# Patient Record
Sex: Female | Born: 1950 | Race: White | Hispanic: No | Marital: Married | Smoking: Never smoker
Health system: Southern US, Community
[De-identification: ages and names within clinical notes are randomized; demographics above are authoritative.]

## PROBLEM LIST (undated history)

## (undated) DIAGNOSIS — R011 Cardiac murmur, unspecified: Secondary | ICD-10-CM

## (undated) DIAGNOSIS — E785 Hyperlipidemia, unspecified: Secondary | ICD-10-CM

## (undated) DIAGNOSIS — Z923 Personal history of irradiation: Secondary | ICD-10-CM

## (undated) DIAGNOSIS — C801 Malignant (primary) neoplasm, unspecified: Secondary | ICD-10-CM

## (undated) DIAGNOSIS — C50919 Malignant neoplasm of unspecified site of unspecified female breast: Secondary | ICD-10-CM

## (undated) DIAGNOSIS — H269 Unspecified cataract: Secondary | ICD-10-CM

## (undated) DIAGNOSIS — B019 Varicella without complication: Secondary | ICD-10-CM

## (undated) DIAGNOSIS — T7840XA Allergy, unspecified, initial encounter: Secondary | ICD-10-CM

## (undated) DIAGNOSIS — Z9221 Personal history of antineoplastic chemotherapy: Secondary | ICD-10-CM

## (undated) HISTORY — PX: BREAST SURGERY: SHX581

## (undated) HISTORY — PX: DILATION AND CURETTAGE OF UTERUS: SHX78

## (undated) HISTORY — DX: Varicella without complication: B01.9

## (undated) HISTORY — DX: Hyperlipidemia, unspecified: E78.5

## (undated) HISTORY — DX: Unspecified cataract: H26.9

## (undated) HISTORY — DX: Cardiac murmur, unspecified: R01.1

## (undated) HISTORY — DX: Allergy, unspecified, initial encounter: T78.40XA

## (undated) HISTORY — DX: Malignant (primary) neoplasm, unspecified: C80.1

---

## 1999-11-17 DIAGNOSIS — C50919 Malignant neoplasm of unspecified site of unspecified female breast: Secondary | ICD-10-CM

## 1999-11-17 DIAGNOSIS — C801 Malignant (primary) neoplasm, unspecified: Secondary | ICD-10-CM

## 1999-11-17 DIAGNOSIS — Z9221 Personal history of antineoplastic chemotherapy: Secondary | ICD-10-CM

## 1999-11-17 DIAGNOSIS — Z853 Personal history of malignant neoplasm of breast: Secondary | ICD-10-CM | POA: Insufficient documentation

## 1999-11-17 DIAGNOSIS — Z923 Personal history of irradiation: Secondary | ICD-10-CM

## 1999-11-17 HISTORY — DX: Malignant (primary) neoplasm, unspecified: C80.1

## 1999-11-17 HISTORY — PX: BREAST LUMPECTOMY: SHX2

## 1999-11-17 HISTORY — DX: Personal history of antineoplastic chemotherapy: Z92.21

## 1999-11-17 HISTORY — PX: BREAST BIOPSY: SHX20

## 1999-11-17 HISTORY — DX: Personal history of irradiation: Z92.3

## 1999-11-17 HISTORY — DX: Malignant neoplasm of unspecified site of unspecified female breast: C50.919

## 2000-11-16 HISTORY — PX: BREAST SURGERY: SHX581

## 2006-09-27 ENCOUNTER — Ambulatory Visit: Payer: Self-pay | Admitting: Specialist

## 2007-04-01 ENCOUNTER — Ambulatory Visit: Payer: Self-pay | Admitting: Unknown Physician Specialty

## 2012-06-11 LAB — HM PAP SMEAR: HM Pap smear: NORMAL

## 2012-06-24 ENCOUNTER — Ambulatory Visit: Payer: Self-pay | Admitting: Unknown Physician Specialty

## 2012-08-18 LAB — HM COLONOSCOPY

## 2013-06-13 ENCOUNTER — Ambulatory Visit: Payer: Self-pay | Admitting: Obstetrics and Gynecology

## 2013-11-30 ENCOUNTER — Encounter: Payer: Self-pay | Admitting: Internal Medicine

## 2013-11-30 ENCOUNTER — Ambulatory Visit (INDEPENDENT_AMBULATORY_CARE_PROVIDER_SITE_OTHER): Payer: 59 | Admitting: Internal Medicine

## 2013-11-30 VITALS — BP 104/70 | HR 85 | Temp 98.0°F | Resp 16 | Wt 121.0 lb

## 2013-11-30 DIAGNOSIS — Z853 Personal history of malignant neoplasm of breast: Secondary | ICD-10-CM

## 2013-11-30 DIAGNOSIS — K5900 Constipation, unspecified: Secondary | ICD-10-CM | POA: Insufficient documentation

## 2013-11-30 DIAGNOSIS — R142 Eructation: Secondary | ICD-10-CM

## 2013-11-30 DIAGNOSIS — E876 Hypokalemia: Secondary | ICD-10-CM

## 2013-11-30 DIAGNOSIS — R143 Flatulence: Secondary | ICD-10-CM

## 2013-11-30 DIAGNOSIS — R140 Abdominal distension (gaseous): Secondary | ICD-10-CM | POA: Insufficient documentation

## 2013-11-30 DIAGNOSIS — R141 Gas pain: Secondary | ICD-10-CM

## 2013-11-30 LAB — COMPREHENSIVE METABOLIC PANEL
ALT: 17 U/L (ref 0–35)
AST: 24 U/L (ref 0–37)
Albumin: 4 g/dL (ref 3.5–5.2)
Alkaline Phosphatase: 42 U/L (ref 39–117)
BUN: 14 mg/dL (ref 6–23)
CO2: 30 meq/L (ref 19–32)
Calcium: 9.2 mg/dL (ref 8.4–10.5)
Chloride: 101 mEq/L (ref 96–112)
Creatinine, Ser: 0.8 mg/dL (ref 0.4–1.2)
GFR: 73.48 mL/min (ref >60.00)
Glucose, Bld: 67 mg/dL — ABNORMAL LOW (ref 70–99)
Potassium: 3.3 mEq/L — ABNORMAL LOW (ref 3.5–5.1)
SODIUM: 138 meq/L (ref 135–145)
TOTAL PROTEIN: 7.3 g/dL (ref 6.0–8.3)
Total Bilirubin: 0.8 mg/dL (ref 0.3–1.2)

## 2013-11-30 LAB — TSH: TSH: 0.85 u[IU]/mL (ref 0.35–5.50)

## 2013-11-30 NOTE — Patient Instructions (Signed)
Trial of amitiza twice daily or Linzess once daily for constipation  This is  my version of a  "Low GI"  Diet:  It is very fiber rich ,  Due to the flatbreads,  And atkins bars   We will e mail you the results of your labs today     All of the foods can be found at grocery stores and in bulk at [employer].  The Atkins protein bars and shakes are available in more varieties at [employer], [employer] and [place].     7 AM Breakfast:  Choose from the following:  Low carbohydrate Protein  Shakes (I recommend the EAS AdvantEdge "Carb Control" shakes  Or the low carb shakes by Atkins.    2.5 carbs   Arnold's "Sandwhich Thin"toasted  w/ peanut butter (no jelly: about 20 net carbs  "Bagel Thin" with cream cheese and salmon: about 20 carbs   a scrambled egg/bacon/cheese burrito made with Mission's "carb balance" whole wheat tortilla  (about 10 net carbs )   Avoid cereal and bananas, oatmeal and cream of wheat and grits. They are loaded with carbohydrates!   10 AM: high protein snack  Protein bar by Atkins (the snack size, under 200 cal, usually < 6 net carbs).    A stick of cheese:  Around 1 carb,  100 cal     Dannon Light n Fit [place] Yogurt  (80 cal, 8 carbs)  Other so called "protein bars" and Greek yogurts tend to be loaded with carbohydrates.  Remember, in food advertising, the word "energy" is synonymous for " carbohydrate."  Lunch:   A Sandwich using the bread choices listed, Can use any  Eggs,  lunchmeat, grilled meat or canned tuna), avocado, regular mayo/mustard  and cheese.  A Salad using blue cheese, ranch,  Goddess or vinagrette,  No croutons or "confetti" and no "candied nuts" but regular nuts OK.   No pretzels or chips.  Pickles and miniature sweet peppers are a good low carb alternative that provide a "crunch"  The bread is the only source of carbohydrate in a sandwich and  can be decreased by trying some of these alternatives to traditional loaf bread  Joseph's makes a pita bread  and a flat bread that are 50 cal and 4 net carbs available at [place] and [place].  This can be toasted to use with hummous as well  Toufayan makes a low carb flatbread that's 100 cal and 9 net carbs available at [employer] and [employer]J's makes 2 sizes of  Low carb whole wheat tortilla  (The large one is 210 cal and 6 net carbs) Avoid "Low fat dressings, as well as Barry Brunner and [place] dressings They are loaded with sugar!   3 PM/ Mid day  Snack:  Consider  1 ounce of  almonds, walnuts, pistachios, pecans, peanuts,  Macadamia nuts or a nut medley.  Avoid "granola"; the dried cranberries and raisins are loaded with carbohydrates. Mixed nuts as long as there are no raisins,  cranberries or dried fruit.     6 PM  Dinner:     Meat/fowl/fish with a green salad, and either broccoli, cauliflower, green beans, spinach, brussel sprouts or  Lima beans. DO NOT BREAD THE PROTEIN!!      There is a low carb pasta by Dreamfield's that is acceptable and tastes great: only 5 digestible carbs/serving.( All grocery stores but BJs carry it )  Try Hurley Cisco Angelo's chicken piccata or chicken or  eggplant parm over low carb pasta.(Lowes and BJs)   Marjory Lies Sanchez's "Carnitas" (pulled pork, no sauce,  0 carbs) or his beef pot roast to make a dinner burrito (at [employer]J's)  Pesto over low carb pasta (bj's sells a good quality pesto in the center refrigerated section of the deli   Whole wheat pasta is still full of digestible carbs and  Not as low in glycemic index as Dreamfield's.   Brown rice is still rice,  So skip the rice and noodles if you eat [place] or [place] (or at least limit to 1/2 cup)  9 PM snack :   Breyer's "low carb" fudgsicle or  ice cream bar (Carb Smart line), or  Weight Watcher's ice cream bar , or another "no sugar added" ice cream;  a serving of fresh berries/cherries with whipped cream   Cheese or DANNON'S LlGHT N FIT G[place] YOGURT  Avoid bananas, pineapple, grapes  and watermelon on a regular basis  because they are high in sugar.  THINK OF THEM AS DESSERT  Remember that snack Substitutions should be less than 10 NET carbs per serving and meals < 20 carbs. Remember to subtract fiber grams to get the "net carbs."

## 2013-11-30 NOTE — Progress Notes (Signed)
Patient ID: Mackenzie Dean, female   DOB: Feb 20, 1951, 63 y.o.   MRN: [id number]   Patient Active Problem List   Diagnosis Date Noted  . Hypokalemia 12/02/2013  . History of breast cancer in female 12/02/2013  . Abdominal bloating 11/30/2013  . Unspecified constipation 11/30/2013    Subjective:  CC:   Chief Complaint  Patient presents with  . Establish Care    HPI:   Mackenzie Dean is a 63 y.o. female who presents as a new patient to establish primary care with the chief complaint of   Lower abdominal swelling, accompanied by mild discomfort and recurrent constipation.   Concerned abut ovarian Ca.  Has all the symptoms .  No prior imaging  Bowel movements have become constipated  Aggravated by travel.  Last colonoscopy was 2013 or 2014  Was normal. turning 62 transferred from [facility]   History of breast cancer  2001 diagnosed during perimenopause.  Left breast tumor was missed by her screening mammogram.  But calcifications were seen on the right and biopsied by Dr  Bary Castilla.  The path report was not malignant but not normal, so lumpectomy was planned,  And the night before the planned lumkpectomy of the right breast , while lying on her side, she felt a lump on the left breast!  Was biopsied along with the calcifications and the malignancy was found .    Went to [facility] for [employer]. She was treated with lumpectomy,  Chemo and XRT  She took adjunctivejhormonal therapy with tamoxifen, then aromasin and finally Femara due to joint pain .  At the 5 year mark the adjuncitve hormone therapy  And Evista was started both for ongoing risk reduction and osteopenia.   Notices it raises her cholesterol. was stopped . treated at [facility] by Fonnie Jarvis oncologist. Lum Babe retired,  Surgeon  All the originial team is gone. seeon annually now and wants t move on.  [facility] for [facility] fore ovary surveillance due to tamoxifen ,  Annual pelvic .   Annual MRI of breasts , last one done April  2014 at [facility] mammogram was done here and images sent to [facility] in July 2014.Marland Kitchen   Past Medical History  Diagnosis Date  . Cancer 2001    Breast  . Chicken pox   . Allergy   . Hyperlipidemia     Past Surgical History  Procedure Laterality Date  . Breast surgery Left 2002    Family History  Problem Relation Age of Onset  . Mental retardation Mother   . Heart disease Mother   . Heart disease Father   . Stroke Father     History   Social History  . Marital Status: Married    Spouse Name: N/A    Number of Children: N/A  . Years of Education: N/A   Occupational History  . Not on file.   Social History Main Topics  . Smoking status: Never Smoker   . Smokeless tobacco: Never Used  . Alcohol Use: Yes  . Drug Use: No  . Sexual Activity: Yes   Other Topics Concern  . Not on file   Social History Narrative  . No narrative on file    Allergies  Allergen Reactions  . Augmentin [Amoxicillin-Pot Clavulanate] Diarrhea      Review of Systems:  Patient denies headache, fevers, malaise, unintentional weight loss, skin rash, eye pain, sinus congestion and sinus pain, sore throat, dysphagia,  hemoptysis , cough, dyspnea, wheezing, chest pain, palpitations, orthopnea,  edema, abdominal pain, nausea, melena, diarrhea, constipation, flank pain, dysuria, hematuria, urinary  Frequency, nocturia, numbness, tingling, seizures,  Focal weakness, Loss of consciousness,  Tremor, insomnia, depression, anxiety, and suicidal ideation.         Objective:  BP 104/70  Pulse 85  Temp(Src) 98 F (36.7 C) (Oral)  Resp 16  Wt 121 lb (54.885 kg)  SpO2 99%  General appearance: alert, cooperative and appears stated age Ears: normal TM's and external ear canals both ears Throat: lips, mucosa, and tongue normal; teeth and gums normal Neck: no adenopathy, no carotid bruit, supple, symmetrical, trachea midline and thyroid not enlarged, symmetric, no tenderness/mass/nodules Back: symmetric,  no curvature. ROM normal. No CVA tenderness. Lungs: clear to auscultation bilaterally Heart: regular rate and rhythm, S1, S2 normal, no murmur, click, rub or gallop Abdomen: soft, non-tender; bowel sounds normal; no masses,  no organomegaly Pulses: 2+ and symmetric Skin: Skin color, texture, turgor normal. No rashes or lesions Lymph nodes: Cervical, supraclavicular, and axillary nodes normal.  Assessment and Plan:  Abdominal bloating Ultrasound and CA 125 were reassuring for no signs o f ovarian CA.  Constipation may be source.  Will give samples of linzess and amitiza to try.  Hypokalemia Unclear etiology but may be due to low mg  Will replace and repeat in one month  History of breast cancer in female Patient's treatment was completed at [facility] but the original team has either retired or left the system.  She would like to consider local surveillance . Has been receiving annualr MRi alternating with mammogram.  Records requested  A total of 60 minutes was spent with patient more than half of which was spent in counseling, reviewing records from other prviders and coordination of care.  Updated Medication List Outpatient Encounter Prescriptions as of 11/30/2013  Medication Sig  . calcium-vitamin D (OSCAL WITH D) 500-200 MG-UNIT per tablet Take 1 tablet by mouth daily with breakfast.  . cetirizine (ZYRTEC) 10 MG tablet Take 10 mg by mouth daily.  Marland Kitchen estradiol (ESTRACE) 0.1 MG/GM vaginal cream Place 1 Applicatorful vaginally once a week.  . Omega-3 Fatty Acids (FISH OIL) 1200 MG CAPS Take 1 capsule by mouth 2 (two) times daily.  . potassium chloride (K-DUR,KLOR-CON) 10 MEQ tablet Take 1 tablet (10 mEq total) by mouth 2 (two) times daily.  . raloxifene (EVISTA) 60 MG tablet Take 60 mg by mouth daily.     Orders Placed This Encounter  Procedures  . US Transvaginal Non-OB  . CA 125  . Comp Met (CMET)  . TSH    No Follow-up on file.

## 2013-12-01 ENCOUNTER — Ambulatory Visit: Payer: Self-pay | Admitting: Internal Medicine

## 2013-12-01 LAB — CA 125: CA 125: 6.8 U/mL (ref 0.0–30.2)

## 2013-12-02 ENCOUNTER — Encounter: Payer: Self-pay | Admitting: Internal Medicine

## 2013-12-02 DIAGNOSIS — Z853 Personal history of malignant neoplasm of breast: Secondary | ICD-10-CM | POA: Insufficient documentation

## 2013-12-02 DIAGNOSIS — E876 Hypokalemia: Secondary | ICD-10-CM | POA: Insufficient documentation

## 2013-12-02 MED ORDER — POTASSIUM CHLORIDE CRYS ER 10 MEQ PO TBCR: 10.0000 meq | EXTENDED_RELEASE_TABLET | Freq: Two times a day (BID) | ORAL | Status: DC

## 2013-12-02 NOTE — Assessment & Plan Note (Signed)
Unclear etiology but may be due to low mg  Will replace and repeat in one month

## 2013-12-02 NOTE — Assessment & Plan Note (Signed)
Patient's treatment was completed at [facility] but the original team has either retired or left the system.  She would like to consider local surveillance . Has been receiving annualr MRi alternating with mammogram.  Records requested

## 2013-12-02 NOTE — Assessment & Plan Note (Addendum)
Ultrasound and CA 125 were reassuring for no signs o f ovarian CA.  Constipation may be source.  Will give samples of linzess and amitiza to try.

## 2014-01-09 ENCOUNTER — Encounter: Payer: Self-pay | Admitting: Internal Medicine

## 2014-02-09 ENCOUNTER — Ambulatory Visit (INDEPENDENT_AMBULATORY_CARE_PROVIDER_SITE_OTHER)
Admission: RE | Admit: 2014-02-09 | Discharge: 2014-02-09 | Disposition: A | Discharge status: Home / Self Care | Payer: 59 | Source: Ambulatory Visit | Attending: Internal Medicine | Admitting: Internal Medicine

## 2014-02-09 ENCOUNTER — Ambulatory Visit (INDEPENDENT_AMBULATORY_CARE_PROVIDER_SITE_OTHER): Payer: 59 | Admitting: Internal Medicine

## 2014-02-09 ENCOUNTER — Encounter: Payer: Self-pay | Admitting: Internal Medicine

## 2014-02-09 VITALS — BP 110/72 | HR 87 | Temp 97.8°F | Wt 119.0 lb

## 2014-02-09 DIAGNOSIS — R05 Cough: Secondary | ICD-10-CM

## 2014-02-09 DIAGNOSIS — R059 Cough, unspecified: Secondary | ICD-10-CM

## 2014-02-09 DIAGNOSIS — J209 Acute bronchitis, unspecified: Secondary | ICD-10-CM

## 2014-02-09 LAB — HM MAMMOGRAPHY: HM MAMMO: NORMAL

## 2014-02-09 MED ORDER — ALBUTEROL SULFATE HFA 108 (90 BASE) MCG/ACT IN AERS: 2.0000 | INHALATION_SPRAY | Freq: Four times a day (QID) | RESPIRATORY_TRACT | Status: DC | PRN

## 2014-02-09 NOTE — Progress Notes (Signed)
Pre visit review using our clinic review tool, if applicable. No additional management support is needed unless otherwise documented below in the visit note. 

## 2014-02-09 NOTE — Progress Notes (Signed)
Subjective:    Patient ID: Mackenzie Dean, female    DOB: [birth year], 63 y.o.   MRN: [id number]  HPI  Pt presents to the clinic today withc/o persistent cough. She reports this started 4 weeks ago. It originally started out as a cold. That last for a week then got better. Over the next week, she felt like it started to drain into her chest. The cough was productive at that time. She did take a zpack. Since that time, she feels like the cough has gotten and gotten more congested. The cough is not really productive at this time. She did have fever, but denies chills or body aches. She has not taken anything else OTC. She has no history of allergies or asthma. She does not smoke. She has not had sick contacts.  Review of Systems      Past Medical History  Diagnosis Date  . Cancer 2001    Breast  . Chicken pox   . Allergy   . Hyperlipidemia     Current Outpatient Prescriptions  Medication Sig Dispense Refill  . aspirin 81 MG tablet Take 81 mg by mouth daily.      . calcium-vitamin D (OSCAL WITH D) 500-200 MG-UNIT per tablet Take 1 tablet by mouth daily with breakfast.      . cetirizine (ZYRTEC) 10 MG tablet Take 10 mg by mouth daily.      Marland Kitchen estradiol (ESTRACE) 0.1 MG/GM vaginal cream Place 1 Applicatorful vaginally once a week.      . Multiple Vitamins-Minerals (MULTIVITAMIN PO) Take 1 capsule by mouth daily.      . Omega-3 Fatty Acids (FISH OIL) 1200 MG CAPS Take 1 capsule by mouth 2 (two) times daily.      . potassium chloride (K-DUR,KLOR-CON) 10 MEQ tablet Take 1 tablet (10 mEq total) by mouth 2 (two) times daily.  10 tablet  1  . raloxifene (EVISTA) 60 MG tablet Take 60 mg by mouth daily.      Marland Kitchen albuterol (PROVENTIL HFA;VENTOLIN HFA) 108 (90 BASE) MCG/ACT inhaler Inhale 2 puffs into the lungs every 6 (six) hours as needed for wheezing or shortness of breath.  1 Inhaler  0   No current facility-administered medications for this visit.    Allergies  Allergen Reactions  .  Augmentin [Amoxicillin-Pot Clavulanate] Diarrhea    Family History  Problem Relation Age of Onset  . Mental retardation Mother   . Heart disease Mother   . Heart disease Father   . Stroke Father     History   Social History  . Marital Status: Married    Spouse Name: N/A    Number of Children: N/A  . Years of Education: N/A   Occupational History  . Not on file.   Social History Main Topics  . Smoking status: Never Smoker   . Smokeless tobacco: Never Used  . Alcohol Use: Yes  . Drug Use: No  . Sexual Activity: Yes   Other Topics Concern  . Not on file   Social History Narrative  . No narrative on file     Constitutional: Denies fever, malaise, fatigue, headache or abrupt weight changes.  HEENT: Denies eye pain, eye redness, ear pain, ringing in the ears, wax buildup, runny nose, nasal congestion, bloody nose, or sore throat. Respiratory: Pt reports cough. Denies difficulty breathing, shortness of breath,  or sputum production.     No other specific complaints in a complete review of systems (except as listed in HPI  above).  Objective:   Physical Exam   BP 110/72  Pulse 87  Temp(Src) 97.8 F (36.6 C) (Oral)  Wt 119 lb (53.978 kg)  SpO2 99% Wt Readings from Last 3 Encounters:  02/09/14 119 lb (53.978 kg)  11/30/13 121 lb (54.885 kg)    General: Appears her stated age, well developed, well nourished in NAD. HEENT: Head: normal shape and size; Eyes: sclera white, no icterus, conjunctiva pink, PERRLA and EOMs intact; Ears: Tm's gray and intact, normal light reflex; Nose: mucosa pink and moist, septum midline; Throat/Mouth: Teeth present, mucosa pink and moist, no exudate, lesions or ulcerations noted.  Cardiovascular: Normal rate and rhythm. S1,S2 noted.  No murmur, rubs or gallops noted. No JVD or BLE edema. No carotid bruits noted. Pulmonary/Chest: Normal effort and positive vesicular breath sounds. No respiratory distress. No wheezes, rales or ronchi noted.          Assessment & Plan:   Cough, likely lingering from acute bronchitis:  Will check xray today to r/o pna She declines rx for benzonate She would like a refill of albuterol inhaler that she tool before when she had bronchitis  RTC as needed or if symptoms persist or worsen

## 2014-02-09 NOTE — Patient Instructions (Addendum)

## 2014-06-11 ENCOUNTER — Encounter: Payer: 59 | Admitting: Internal Medicine

## 2014-06-11 ENCOUNTER — Ambulatory Visit (INDEPENDENT_AMBULATORY_CARE_PROVIDER_SITE_OTHER): Payer: 59 | Admitting: Internal Medicine

## 2014-06-11 ENCOUNTER — Encounter: Payer: Self-pay | Admitting: Internal Medicine

## 2014-06-11 VITALS — BP 114/70 | HR 86 | Temp 98.4°F | Resp 16 | Ht 64.0 in | Wt 119.5 lb

## 2014-06-11 DIAGNOSIS — H60399 Other infective otitis externa, unspecified ear: Secondary | ICD-10-CM

## 2014-06-11 DIAGNOSIS — Z23 Encounter for immunization: Secondary | ICD-10-CM

## 2014-06-11 DIAGNOSIS — Z8739 Personal history of other diseases of the musculoskeletal system and connective tissue: Secondary | ICD-10-CM | POA: Diagnosis not present

## 2014-06-11 DIAGNOSIS — Z853 Personal history of malignant neoplasm of breast: Secondary | ICD-10-CM

## 2014-06-11 DIAGNOSIS — Z2911 Encounter for prophylactic immunotherapy for respiratory syncytial virus (RSV): Secondary | ICD-10-CM | POA: Diagnosis not present

## 2014-06-11 DIAGNOSIS — R079 Chest pain, unspecified: Secondary | ICD-10-CM | POA: Diagnosis not present

## 2014-06-11 DIAGNOSIS — Z0000 Encounter for general adult medical examination without abnormal findings: Secondary | ICD-10-CM | POA: Diagnosis not present

## 2014-06-11 DIAGNOSIS — R002 Palpitations: Secondary | ICD-10-CM | POA: Diagnosis not present

## 2014-06-11 DIAGNOSIS — H60392 Other infective otitis externa, left ear: Secondary | ICD-10-CM

## 2014-06-11 MED ORDER — CLOTRIMAZOLE-BETAMETHASONE 1-0.05 % EX CREA: 1.0000 "application " | TOPICAL_CREAM | Freq: Two times a day (BID) | CUTANEOUS | Status: DC

## 2014-06-11 NOTE — Progress Notes (Signed)
Patient ID: Mackenzie Dean, female   DOB: 18-Mar-1951, 63 y.o.   MRN: [id number]    Subjective:     Mackenzie Dean is a 63 y.o. female and is here for a comprehensive physical exam. The patient reports problems - as follows.  Her  last PAP smear was normal in 2013.  She has no history of abnormal PAP smears. She is requesting pelvic exam today.   She is a breast cancer survivor who is no longer under surveillance by [facility], and would like ongoing surveillance with breast MRI annually.  Cc:  She has been having palpitations which have been occurring infrequently .  The feeling is accompanied by a feeling of chest pressure and feeling off balance without true vertigo. She has a FH of atrial fib in mother, who eventually developed SSS needing a  pacemaker.  Paternal uncle also had early CAD,  And there is a strong FH of stroke in both parents. Her husband Dr Golden Pop is requesting a cardiology evaluation    History   Social History  . Marital Status: Married    Spouse Name: N/A    Number of Children: N/A  . Years of Education: N/A   Occupational History  . Not on file.   Social History Main Topics  . Smoking status: Never Smoker   . Smokeless tobacco: Never Used  . Alcohol Use: Yes  . Drug Use: No  . Sexual Activity: Yes   Other Topics Concern  . Not on file   Social History Narrative  . No narrative on file   Health Maintenance  Topic Date Due  . Colonoscopy  10/26/2003  . Influenza Vaccine  06/16/2014  . Pap Smear  06/12/2015  . Mammogram  02/10/2016  . Tetanus/tdap  06/06/2023  . Zostavax  Completed    The following portions of the patient's history were reviewed and updated as appropriate: allergies, current medications, past family history, past medical history, past social history, past surgical history and problem list.  Review of Systems A comprehensive review of systems was negative except for: Cardiovascular: positive for exertional chest  pressure/discomfort and palpitations   Objective:   BP 114/70  Pulse 86  Temp(Src) 98.4 F (36.9 C) (Oral)  Resp 16  Ht 5\' 4"  (1.626 m)  Wt 119 lb 8 oz (54.205 kg)  BMI 20.50 kg/m2  SpO2 97%  General Appearance:    Alert, cooperative, no distress, appears stated age  Head:    Normocephalic, without obvious abnormality, atraumatic  Eyes:    PERRL, conjunctiva/corneas clear, EOM's intact, fundi    benign, both eyes  Ears:    Normal TM's and external ear canals, both ears  Nose:   Nares normal, septum midline, mucosa normal, no drainage    or sinus tenderness  Throat:   Lips, mucosa, and tongue normal; teeth and gums normal  Neck:   Supple, symmetrical, trachea midline, no adenopathy;    thyroid:  no enlargement/tenderness/nodules; no carotid   bruit or JVD  Back:     Symmetric, no curvature, ROM normal, no CVA tenderness  Lungs:     Clear to auscultation bilaterally, respirations unlabored  Chest Wall:    No tenderness or deformity   Heart:    Regular rate and rhythm, S1 and S2 normal, no murmur, rub   or gallop  Breast Exam:    No tenderness, masses, or nipple abnormality  Abdomen:     Soft, non-tender, bowel sounds active all four quadrants,  no masses, no organomegaly  Genitalia:    Pelvic: cervix normal in appearance, external genitalia normal, no adnexal masses or tenderness, no cervical motion tenderness, rectovaginal septum normal, uterus normal size, shape, and consistency and vagina normal without discharge  Extremities:   Extremities normal, atraumatic, no cyanosis or edema  Pulses:   2+ and symmetric all extremities  Skin:   Skin color, texture, turgor normal, no rashes or lesions  Lymph nodes:   Cervical, supraclavicular, and axillary nodes normal  Neurologic:   CNII-XII intact, normal strength, sensation and reflexes    throughout     Assessment and Plan:   Palpitations Her exam today is normal.  However, referral to cardiology is requested by her husband Dr  Jeananne Rama due to [place] of arrhythmia.   Referral is in process  Chest pain, unspecified Occasional epsiodes of chest pressure,  Mild. Occurring with and without exertion.  She has no personal RFs for CAD but has a FH .   Her exam today is normal.  However, referral to cardiology is requested by her husband Dr Jeananne Rama due to [facility] of arrhythmia.   Referral is in process  History of breast cancer in female Annual mammogram and breast MRI to be done at [facility] in October, ordered by me, per patient request. Breast exam was done today  H/O osteopenia Will repeat DEXA in Fall   Visit for preventive health examination Annual comprehensive exam was done including breast and pelvic exam . All screenings have been addressed .   A total of 60 minutes was spent with patient more than half of which was spent in counseling, reviewing records from other prviders and coordination of care.  Updated Medication List Outpatient Encounter Prescriptions as of 06/11/2014  Medication Sig  . aspirin 81 MG tablet Take 81 mg by mouth daily.  . calcium-vitamin D (OSCAL WITH D) 500-200 MG-UNIT per tablet Take 1 tablet by mouth daily with breakfast.  . cetirizine (ZYRTEC) 10 MG tablet Take 10 mg by mouth daily.  . clotrimazole-betamethasone (LOTRISONE) lotion Apply 1 application topically 2 (two) times daily.  Marland Kitchen estradiol (ESTRACE) 0.1 MG/GM vaginal cream Place 1 Applicatorful vaginally once a week.  . Multiple Vitamins-Minerals (MULTIVITAMIN PO) Take 1 capsule by mouth daily.  . Omega-3 Fatty Acids (FISH OIL) 1200 MG CAPS Take 1 capsule by mouth 2 (two) times daily.  . raloxifene (EVISTA) 60 MG tablet Take 60 mg by mouth daily.  Marland Kitchen albuterol (PROVENTIL HFA;VENTOLIN HFA) 108 (90 BASE) MCG/ACT inhaler Inhale 2 puffs into the lungs every 6 (six) hours as needed for wheezing or shortness of breath.  . clotrimazole-betamethasone (LOTRISONE) cream Apply 1 application topically 2 (two) times daily.  . potassium chloride  (K-DUR,KLOR-CON) 10 MEQ tablet Take 1 tablet (10 mEq total) by mouth 2 (two) times daily.

## 2014-06-11 NOTE — Patient Instructions (Signed)
You had your annual  wellness exam today.  We will repeat your PAP smear in 2018, sooner if needed   We will schedule your mammogram at [facility] in  August and your MRI in December .  You  May be due for the TDaP vaccine and the Singles vaccine   Please make an appt for fasting labs at your earliest convenience.   We will contact you with the bloodwork results  Health Maintenance Adopting a healthy lifestyle and getting preventive care can go a long way to promote health and wellness. Talk with your health care provider about what schedule of regular examinations is right for you. This is a good chance for you to check in with your provider about disease prevention and staying healthy. In between checkups, there are plenty of things you can do on your own. Experts have done a lot of research about which lifestyle changes and preventive measures are most likely to keep you healthy. Ask your health care provider for more information. WEIGHT AND DIET  Eat a healthy diet  Be sure to include plenty of vegetables, fruits, low-fat dairy products, and lean protein.  Do not eat a lot of foods high in solid fats, added sugars, or salt.  Get regular exercise. This is one of the most important things you can do for your health.  Most adults should exercise for at least 150 minutes each week. The exercise should increase your heart rate and make you sweat (moderate-intensity exercise).  Most adults should also do strengthening exercises at least twice a week. This is in addition to the moderate-intensity exercise.  Maintain a healthy weight  Body mass index (BMI) is a measurement that can be used to identify possible weight problems. It estimates body fat based on height and weight. Your health care provider can help determine your BMI and help you achieve or maintain a healthy weight.  For females 55 years of age and older:   A BMI below 18.5 is considered underweight.  A BMI of 18.5 to 24.9 is  normal.  A BMI of 25 to 29.9 is considered overweight.  A BMI of 30 and above is considered obese.  Watch levels of cholesterol and blood lipids  You should start having your blood tested for lipids and cholesterol at 61 years of age, then have this test every 5 years.  You may need to have your cholesterol levels checked more often if:  Your lipid or cholesterol levels are high.  You are older than 61 years of age.  You are at high risk for heart disease.  CANCER SCREENING   Lung Cancer  Lung cancer screening is recommended for adults 83-33 years old who are at high risk for lung cancer because of a history of smoking.  A yearly low-dose CT scan of the lungs is recommended for people who:  Currently smoke.  Have quit within the past 15 years.  Have at least a 30-pack-year history of smoking. A pack year is smoking an average of one pack of cigarettes a day for 1 year.  Yearly screening should continue until it has been 15 years since you quit.  Yearly screening should stop if you develop a health problem that would prevent you from having lung cancer treatment.  Breast Cancer  Practice breast self-awareness. This means understanding how your breasts normally appear and feel.  It also means doing regular breast self-exams. Let your health care provider know about any changes, no matter how small.  If you are in your 20s or 30s, you should have a clinical breast exam (CBE) by a health care provider every 1-3 years as part of a regular health exam.  If you are 70 or older, have a CBE every year. Also consider having a breast X-ray (mammogram) every year.  If you have a family history of breast cancer, talk to your health care provider about genetic screening.  If you are at high risk for breast cancer, talk to your health care provider about having an MRI and a mammogram every year.  Breast cancer gene (BRCA) assessment is recommended for women who have family members  with BRCA-related cancers. BRCA-related cancers include:  Breast.  Ovarian.  Tubal.  Peritoneal cancers.  Results of the assessment will determine the need for genetic counseling and BRCA1 and BRCA2 testing. Cervical Cancer Routine pelvic examinations to screen for cervical cancer are no longer recommended for nonpregnant women who are considered low risk for cancer of the pelvic organs (ovaries, uterus, and vagina) and who do not have symptoms. A pelvic examination may be necessary if you have symptoms including those associated with pelvic infections. Ask your health care provider if a screening pelvic exam is right for you.   The Pap test is the screening test for cervical cancer for women who are considered at risk.  If you had a hysterectomy for a problem that was not cancer or a condition that could lead to cancer, then you no longer need Pap tests.  If you are older than 65 years, and you have had normal Pap tests for the past 10 years, you no longer need to have Pap tests.  If you have had past treatment for cervical cancer or a condition that could lead to cancer, you need Pap tests and screening for cancer for at least 20 years after your treatment.  If you no longer get a Pap test, assess your risk factors if they change (such as having a new sexual partner). This can affect whether you should start being screened again.  Some women have medical problems that increase their chance of getting cervical cancer. If this is the case for you, your health care provider may recommend more frequent screening and Pap tests.  The human papillomavirus (HPV) test is another test that may be used for cervical cancer screening. The HPV test looks for the virus that can cause cell changes in the cervix. The cells collected during the Pap test can be tested for HPV.  The HPV test can be used to screen women 34 years of age and older. Getting tested for HPV can extend the interval between normal  Pap tests from three to five years.  An HPV test also should be used to screen women of any age who have unclear Pap test results.  After 61 years of age, women should have HPV testing as often as Pap tests.  Colorectal Cancer  This type of cancer can be detected and often prevented.  Routine colorectal cancer screening usually begins at 61 years of age and continues through 61 years of age.  Your health care provider may recommend screening at an earlier age if you have risk factors for colon cancer.  Your health care provider may also recommend using home test kits to check for hidden blood in the stool.  A small camera at the end of a tube can be used to examine your colon directly (sigmoidoscopy or colonoscopy). This is done to check for  the earliest forms of colorectal cancer.  Routine screening usually begins at age 9.  Direct examination of the colon should be repeated every 5-10 years through 61 years of age. However, you may need to be screened more often if early forms of precancerous polyps or small growths are found. Skin Cancer  Check your skin from head to toe regularly.  Tell your health care provider about any new moles or changes in moles, especially if there is a change in a mole's shape or color.  Also tell your health care provider if you have a mole that is larger than the size of a pencil eraser.  Always use sunscreen. Apply sunscreen liberally and repeatedly throughout the day.  Protect yourself by wearing long sleeves, pants, a wide-brimmed hat, and sunglasses whenever you are outside. HEART DISEASE, DIABETES, AND HIGH BLOOD PRESSURE   Have your blood pressure checked at least every 1-2 years. High blood pressure causes heart disease and increases the risk of stroke.  If you are between 19 years and 30 years old, ask your health care provider if you should take aspirin to prevent strokes.  Have regular diabetes screenings. This involves taking a blood  sample to check your fasting blood sugar level.  If you are at a normal weight and have a low risk for diabetes, have this test once every three years after 61 years of age.  If you are overweight and have a high risk for diabetes, consider being tested at a younger age or more often. PREVENTING INFECTION  Hepatitis B  If you have a higher risk for hepatitis B, you should be screened for this virus. You are considered at high risk for hepatitis B if:  You were born in a country where hepatitis B is common. Ask your health care provider which countries are considered high risk.  Your parents were born in a high-risk country, and you have not been immunized against hepatitis B (hepatitis B vaccine).  You have HIV or AIDS.  You use needles to inject street drugs.  You live with someone who has hepatitis B.  You have had sex with someone who has hepatitis B.  You get hemodialysis treatment.  You take certain medicines for conditions, including cancer, organ transplantation, and autoimmune conditions. Hepatitis C  Blood testing is recommended for:  Everyone born from [birth year] through 1963.  Anyone with known risk factors for hepatitis C. Sexually transmitted infections (STIs)  You should be screened for sexually transmitted infections (STIs) including gonorrhea and chlamydia if:  You are sexually active and are younger than 61 years of age.  You are older than 61 years of age and your health care provider tells you that you are at risk for this type of infection.  Your sexual activity has changed since you were last screened and you are at an increased risk for chlamydia or gonorrhea. Ask your health care provider if you are at risk.  If you do not have HIV, but are at risk, it may be recommended that you take a prescription medicine daily to prevent HIV infection. This is called pre-exposure prophylaxis (PrEP). You are considered at risk if:  You are sexually active and do not  regularly use condoms or know the HIV status of your partner(s).  You take drugs by injection.  You are sexually active with a partner who has HIV. Talk with your health care provider about whether you are at high risk of being infected with HIV. If you  choose to begin PrEP, you should first be tested for HIV. You should then be tested every 3 months for as long as you are taking PrEP.  PREGNANCY   If you are premenopausal and you may become pregnant, ask your health care provider about preconception counseling.  If you may become pregnant, take 400 to 800 micrograms (mcg) of folic acid every day.  If you want to prevent pregnancy, talk to your health care provider about birth control (contraception). OSTEOPOROSIS AND MENOPAUSE   Osteoporosis is a disease in which the bones lose minerals and strength with aging. This can result in serious bone fractures. Your risk for osteoporosis can be identified using a bone density scan.  If you are 101 years of age or older, or if you are at risk for osteoporosis and fractures, ask your health care provider if you should be screened.  Ask your health care provider whether you should take a calcium or vitamin D supplement to lower your risk for osteoporosis.  Menopause may have certain physical symptoms and risks.  Hormone replacement therapy may reduce some of these symptoms and risks. Talk to your health care provider about whether hormone replacement therapy is right for you.  HOME CARE INSTRUCTIONS   Schedule regular health, dental, and eye exams.  Stay current with your immunizations.   Do not use any tobacco products including cigarettes, chewing tobacco, or electronic cigarettes.  If you are pregnant, do not drink alcohol.  If you are breastfeeding, limit how much and how often you drink alcohol.  Limit alcohol intake to no more than 1 drink per day for nonpregnant women. One drink equals 12 ounces of beer, 5 ounces of wine, or 1  ounces of hard liquor.  Do not use street drugs.  Do not share needles.  Ask your health care provider for help if you need support or information about quitting drugs.  Tell your health care provider if you often feel depressed.  Tell your health care provider if you have ever been abused or do not feel safe at home. Document Released: 05/18/2011 Document Revised: 03/19/2014 Document Reviewed: 10/04/2013 [facility] Patient Information 2015 [place], [place]. This information is not intended to replace advice given to you by your health care provider. Make sure you discuss any questions you have with your health care provider.

## 2014-06-12 ENCOUNTER — Telehealth: Payer: Self-pay | Admitting: *Deleted

## 2014-06-12 DIAGNOSIS — Z8739 Personal history of other diseases of the musculoskeletal system and connective tissue: Secondary | ICD-10-CM

## 2014-06-12 DIAGNOSIS — E785 Hyperlipidemia, unspecified: Secondary | ICD-10-CM

## 2014-06-12 DIAGNOSIS — Z0000 Encounter for general adult medical examination without abnormal findings: Secondary | ICD-10-CM | POA: Insufficient documentation

## 2014-06-12 DIAGNOSIS — R002 Palpitations: Secondary | ICD-10-CM

## 2014-06-12 DIAGNOSIS — K5900 Constipation, unspecified: Secondary | ICD-10-CM

## 2014-06-12 NOTE — Assessment & Plan Note (Signed)
Annual mammogram and breast MRI to be done at [facility] in October, ordered by me, per patient request. Breast exam was done today

## 2014-06-12 NOTE — Assessment & Plan Note (Addendum)
Occasional epsiodes of chest pressure,  Mild. Occurring with and without exertion.  She has no personal RFs for CAD but has a FH .   Her exam today is normal.  However, referral to cardiology is requested by her husband Dr Jeananne Rama due to [facility] of arrhythmia.   Referral is in process

## 2014-06-12 NOTE — Assessment & Plan Note (Signed)
Annual comprehensive exam was done including breast and pelvic exam. All screenings have been addressed .  

## 2014-06-12 NOTE — Telephone Encounter (Signed)
Pt is coming in tomorrow what labs and dx?  

## 2014-06-12 NOTE — Assessment & Plan Note (Addendum)
Her exam today is normal.  However, referral to cardiology is requested by her husband Dr Jeananne Rama due to [facility] of arrhythmia.   Referral is in process

## 2014-06-12 NOTE — Assessment & Plan Note (Signed)
Will repeat DEXA in Fall

## 2014-06-12 NOTE — Addendum Note (Signed)
Addended by: Crecencio Mc on: 06/12/2014 05:46 PM   Modules accepted: Orders

## 2014-06-13 ENCOUNTER — Other Ambulatory Visit (INDEPENDENT_AMBULATORY_CARE_PROVIDER_SITE_OTHER): Payer: 59

## 2014-06-13 ENCOUNTER — Encounter: Payer: Self-pay | Admitting: Internal Medicine

## 2014-06-13 DIAGNOSIS — Z8739 Personal history of other diseases of the musculoskeletal system and connective tissue: Secondary | ICD-10-CM

## 2014-06-13 DIAGNOSIS — R002 Palpitations: Secondary | ICD-10-CM

## 2014-06-13 DIAGNOSIS — E785 Hyperlipidemia, unspecified: Secondary | ICD-10-CM

## 2014-06-13 LAB — VITAMIN D 25 HYDROXY (VIT D DEFICIENCY, FRACTURES): VITD: 60.84 ng/mL (ref 30.00–100.00)

## 2014-06-13 LAB — COMPREHENSIVE METABOLIC PANEL
ALBUMIN: 3.9 g/dL (ref 3.5–5.2)
ALT: 22 U/L (ref 0–35)
AST: 25 U/L (ref 0–37)
Alkaline Phosphatase: 42 U/L (ref 39–117)
BUN: 22 mg/dL (ref 6–23)
CHLORIDE: 102 meq/L (ref 96–112)
CO2: 30 mEq/L (ref 19–32)
Calcium: 9.1 mg/dL (ref 8.4–10.5)
Creatinine, Ser: 0.8 mg/dL (ref 0.4–1.2)
GFR: 73.35 mL/min (ref >60.00)
Glucose, Bld: 84 mg/dL (ref 70–99)
POTASSIUM: 4 meq/L (ref 3.5–5.1)
Sodium: 139 mEq/L (ref 135–145)
TOTAL PROTEIN: 6.8 g/dL (ref 6.0–8.3)
Total Bilirubin: 0.7 mg/dL (ref 0.2–1.2)

## 2014-06-13 LAB — CBC WITH DIFFERENTIAL/PLATELET
BASOS ABS: 0 10*3/uL (ref 0.0–0.1)
Basophils Relative: 1 % (ref 0.0–3.0)
EOS ABS: 0.3 10*3/uL (ref 0.0–0.7)
Eosinophils Relative: 7.7 % — ABNORMAL HIGH (ref 0.0–5.0)
HCT: 40 % (ref 36.0–46.0)
Hemoglobin: 13.6 g/dL (ref 12.0–15.0)
LYMPHS PCT: 38.8 % (ref 12.0–46.0)
Lymphs Abs: 1.6 10*3/uL (ref 0.7–4.0)
MCHC: 34 g/dL (ref 30.0–36.0)
MCV: 93.6 fl (ref 78.0–100.0)
MONOS PCT: 6.6 % (ref 3.0–12.0)
Monocytes Absolute: 0.3 10*3/uL (ref 0.1–1.0)
NEUTROS PCT: 45.9 % (ref 43.0–77.0)
Neutro Abs: 1.9 10*3/uL (ref 1.4–7.7)
Platelets: 179 10*3/uL (ref 150.0–400.0)
RBC: 4.27 Mil/uL (ref 3.87–5.11)
RDW: 13.2 % (ref 11.5–15.5)
WBC: 4.2 10*3/uL (ref 4.0–10.5)

## 2014-06-13 LAB — LIPID PANEL
Cholesterol: 176 mg/dL (ref 0–200)
HDL: 57.1 mg/dL (ref >39.00)
LDL CALC: 110 mg/dL — AB (ref 0–99)
NonHDL: 118.9
TRIGLYCERIDES: 44 mg/dL (ref 0.0–149.0)
Total CHOL/HDL Ratio: 3
VLDL: 8.8 mg/dL (ref 0.0–40.0)

## 2014-06-13 LAB — TSH: TSH: 1.65 u[IU]/mL (ref 0.35–4.50)

## 2014-06-13 LAB — MAGNESIUM: Magnesium: 2 mg/dL (ref 1.5–2.5)

## 2014-06-15 ENCOUNTER — Other Ambulatory Visit: Payer: Self-pay | Admitting: Internal Medicine

## 2014-06-15 DIAGNOSIS — L989 Disorder of the skin and subcutaneous tissue, unspecified: Secondary | ICD-10-CM

## 2014-07-04 ENCOUNTER — Ambulatory Visit (INDEPENDENT_AMBULATORY_CARE_PROVIDER_SITE_OTHER): Payer: 59 | Admitting: Cardiovascular Disease

## 2014-07-04 ENCOUNTER — Encounter: Payer: Self-pay | Admitting: Cardiovascular Disease

## 2014-07-04 VITALS — BP 120/72 | HR 72 | Ht 64.0 in | Wt 118.0 lb

## 2014-07-04 DIAGNOSIS — R079 Chest pain, unspecified: Secondary | ICD-10-CM

## 2014-07-04 DIAGNOSIS — I499 Cardiac arrhythmia, unspecified: Secondary | ICD-10-CM

## 2014-07-04 DIAGNOSIS — Z853 Personal history of malignant neoplasm of breast: Secondary | ICD-10-CM

## 2014-07-04 DIAGNOSIS — R0789 Other chest pain: Secondary | ICD-10-CM

## 2014-07-04 DIAGNOSIS — Z923 Personal history of irradiation: Secondary | ICD-10-CM

## 2014-07-04 DIAGNOSIS — R002 Palpitations: Secondary | ICD-10-CM

## 2014-07-04 DIAGNOSIS — Z8249 Family history of ischemic heart disease and other diseases of the circulatory system: Secondary | ICD-10-CM

## 2014-07-04 NOTE — Patient Instructions (Addendum)
You are doing well. No medication changes were made.  Please search for "pulse meter" at the app store [place] "instant heart rate", cardiograph  We will schedule a calcium score/noncontrast cardiac CT for shortness of breath and palpitations, family hx Friday, August 28 @ 2:00 Please arrive at 1:45 There is a fee of $150 due at the time of your procedure  Please call us if you have new issues that need to be addressed before your next appt.

## 2014-07-04 NOTE — Assessment & Plan Note (Addendum)
We discussed tracking her heart rhythm. Suspect she might have some ectopy, APCs or PVCs. Less likely atrial fibrillation. For now she will monitor her heart rhythm on her radial pulse, also using her phone with one of the pulse apps. We have discussed what to look for. She will call us with any abnormal findings. If she does have abnormal findings, Holter monitor could be ordered. We will hold off on short-term beta blocker such as propranolol as needed until she has worsening symptoms

## 2014-07-04 NOTE — Assessment & Plan Note (Signed)
Long discussion about various options for her chest discomfort. Some atypical components of her symptoms as she is exercising well. Given her prior radiation on the left, family history, occasional shortness of breath, we have ordered a calcium score to be done in [place]. This would help guide further management. Encouraged her to call us if symptoms get worse

## 2014-07-04 NOTE — Assessment & Plan Note (Signed)
Significant radiation of the left. This will warrant close monitoring for accelerated coronary artery disease.

## 2014-07-04 NOTE — Progress Notes (Signed)
Patient ID: Mackenzie Dean, female    DOB: 1951-01-29, 63 y.o.   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 She does aerobic activity and other exercises. She was running in a brace one month ago and when this finished, had some chest discomfort. This was the last time she had any symptoms. She does report occasional symptoms of a chest pressure but uncertain if this is from indigestion.  Rarely has palpitations or fluttering feeling in her chest, but has felt this over the past 6 months on a very infrequent basis. She is concerned as he does have a family history of atrial fibrillation.  Total cholesterol 176. No smoking history, no diabetes  EKG shows normal sinus rhythm with rate 72 beats per minute, no significant ST or T wave changes   Outpatient Encounter Prescriptions as of 07/04/2014  Medication Sig  . albuterol (PROVENTIL HFA;VENTOLIN HFA) 108 (90 BASE) MCG/ACT inhaler Inhale 2 puffs into the lungs every 6 (six) hours as needed for wheezing or shortness of breath.  Marland Kitchen aspirin 81 MG tablet Take 81 mg by mouth daily.  . calcium-vitamin D (OSCAL WITH D) 500-200 MG-UNIT per tablet Take 1 tablet by mouth daily with breakfast.  . cetirizine (ZYRTEC) 10 MG tablet Take 10 mg by mouth daily.  . clotrimazole-betamethasone (LOTRISONE) lotion Apply 1 application topically 2 (two) times daily.  Marland Kitchen estradiol (ESTRACE) 0.1 MG/GM vaginal cream Place 1 Applicatorful vaginally once a week.  . Multiple Vitamins-Minerals (MULTIVITAMIN PO) Take 1 capsule by mouth daily.  . Omega-3 Fatty Acids (FISH OIL) 1200 MG CAPS Take 1  capsule by mouth 2 (two) times daily.  . raloxifene (EVISTA) 60 MG tablet Take 60 mg by mouth daily.    Review of Systems  Constitutional: Negative.   HENT: Negative.   Eyes: Negative.   Respiratory: Negative.   Cardiovascular: Negative.   Gastrointestinal: Negative.   Endocrine: Negative.   Musculoskeletal: Negative.   Skin: Negative.   Allergic/Immunologic: Negative.   Neurological: Negative.   Hematological: Negative.   Psychiatric/Behavioral: Negative.   All other systems reviewed and are negative.   BP 120/72  Pulse 72  Ht 5\' 4"  (1.626 m)  Wt 118 lb (53.524 kg)  BMI 20.24 kg/m2  Physical Exam  Nursing note and vitals reviewed. Constitutional: She is oriented to person, place, and time. She appears well-developed and well-nourished.  HENT:  Head: Normocephalic.  Nose: Nose normal.  Mouth/Throat: Oropharynx is clear and moist.  Eyes: Conjunctivae are normal. Pupils are equal, round, and reactive to light.  Neck: Normal range of motion. Neck supple. No JVD present.  Cardiovascular: Normal rate, regular rhythm, S1 normal, S2 normal, normal heart sounds and intact distal pulses.  Exam reveals no gallop and no friction rub.   No murmur heard. Pulmonary/Chest: Effort normal and breath sounds normal. No respiratory distress. She has no wheezes. She has no rales. She exhibits no tenderness.  Abdominal: Soft. Bowel sounds are normal. She exhibits no distension. There is no tenderness.  Musculoskeletal: Normal range of motion. She exhibits no edema and no tenderness.  Lymphadenopathy:    She  has no cervical adenopathy.  Neurological: She is alert and oriented to person, place, and time. Coordination normal.  Skin: Skin is warm and dry. No rash noted. No erythema.  Psychiatric: She has a normal mood and affect. Her behavior is normal. Judgment and thought content normal.    Assessment and Plan

## 2014-07-13 ENCOUNTER — Ambulatory Visit (INDEPENDENT_AMBULATORY_CARE_PROVIDER_SITE_OTHER)
Admission: RE | Admit: 2014-07-13 | Discharge: 2014-07-13 | Disposition: A | Discharge status: Home / Self Care | Payer: Self-pay | Source: Ambulatory Visit | Attending: Cardiovascular Disease | Admitting: Cardiovascular Disease

## 2014-07-13 DIAGNOSIS — R0789 Other chest pain: Secondary | ICD-10-CM

## 2014-07-13 DIAGNOSIS — Z8249 Family history of ischemic heart disease and other diseases of the circulatory system: Secondary | ICD-10-CM

## 2014-07-13 DIAGNOSIS — I499 Cardiac arrhythmia, unspecified: Secondary | ICD-10-CM

## 2014-07-13 DIAGNOSIS — Z923 Personal history of irradiation: Secondary | ICD-10-CM

## 2014-11-22 ENCOUNTER — Encounter: Payer: Self-pay | Admitting: *Deleted

## 2014-11-22 NOTE — Telephone Encounter (Signed)
From: [name] Sent: 08/27/2014 9:30 AM EDT To: Aviva Kluver Subject: RE:Flu Shot Clinic Saturday, 09/01/14 [place]  I have received my flu shot already . . . my husband, Golden Pop, MD, gave it to me on August 20, 2014!!!! Please make a note in my chart! Thanks!  ----- Message ----- From: Genia Plants Sent: 08/27/2014 8:44 AM EDT To: [name] Subject: Flu Shot Clinic Saturday, 09/01/14 [facility] will hold a [employer] on Saturday, October 17 from 9 am to noon. In order to plan appropriately, we ask you to please call the office to schedule an appointment time during the clinic. Our schedulers are ready to assist you, Monday through Friday, 8a to 5p at [phone]. Thank you for choosing [facility] for your healthcare needs.

## 2015-02-26 DIAGNOSIS — C4491 Basal cell carcinoma of skin, unspecified: Secondary | ICD-10-CM

## 2015-02-26 HISTORY — DX: Basal cell carcinoma of skin, unspecified: C44.91

## 2015-06-24 ENCOUNTER — Ambulatory Visit (INDEPENDENT_AMBULATORY_CARE_PROVIDER_SITE_OTHER): Payer: 59 | Admitting: Internal Medicine

## 2015-06-24 ENCOUNTER — Other Ambulatory Visit (HOSPITAL_COMMUNITY)
Admission: RE | Admit: 2015-06-24 | Discharge: 2015-06-24 | Disposition: A | Discharge status: Home / Self Care | Payer: 59 | Source: Ambulatory Visit | Attending: Internal Medicine | Admitting: Internal Medicine

## 2015-06-24 VITALS — BP 104/64 | HR 71 | Temp 98.1°F | Resp 14 | Ht 64.0 in | Wt 117.5 lb

## 2015-06-24 DIAGNOSIS — E785 Hyperlipidemia, unspecified: Secondary | ICD-10-CM

## 2015-06-24 DIAGNOSIS — Z853 Personal history of malignant neoplasm of breast: Secondary | ICD-10-CM

## 2015-06-24 DIAGNOSIS — Z1151 Encounter for screening for human papillomavirus (HPV): Secondary | ICD-10-CM | POA: Insufficient documentation

## 2015-06-24 DIAGNOSIS — Z1159 Encounter for screening for other viral diseases: Secondary | ICD-10-CM

## 2015-06-24 DIAGNOSIS — R5383 Other fatigue: Secondary | ICD-10-CM

## 2015-06-24 DIAGNOSIS — R0789 Other chest pain: Secondary | ICD-10-CM

## 2015-06-24 DIAGNOSIS — Z0000 Encounter for general adult medical examination without abnormal findings: Secondary | ICD-10-CM | POA: Diagnosis not present

## 2015-06-24 DIAGNOSIS — Z124 Encounter for screening for malignant neoplasm of cervix: Secondary | ICD-10-CM

## 2015-06-24 DIAGNOSIS — Z1239 Encounter for other screening for malignant neoplasm of breast: Secondary | ICD-10-CM

## 2015-06-24 DIAGNOSIS — E559 Vitamin D deficiency, unspecified: Secondary | ICD-10-CM | POA: Diagnosis not present

## 2015-06-24 DIAGNOSIS — Z01411 Encounter for gynecological examination (general) (routine) with abnormal findings: Secondary | ICD-10-CM | POA: Diagnosis present

## 2015-06-24 DIAGNOSIS — I700 Atherosclerosis of aorta: Secondary | ICD-10-CM | POA: Diagnosis not present

## 2015-06-24 DIAGNOSIS — Z113 Encounter for screening for infections with a predominantly sexual mode of transmission: Secondary | ICD-10-CM | POA: Diagnosis not present

## 2015-06-24 DIAGNOSIS — Z13820 Encounter for screening for osteoporosis: Secondary | ICD-10-CM

## 2015-06-24 NOTE — Progress Notes (Signed)
Pre-visit discussion using our clinic review tool. No additional management support is needed unless otherwise documented below in the visit note.  

## 2015-06-24 NOTE — Patient Instructions (Signed)

## 2015-06-24 NOTE — Progress Notes (Signed)
Patient ID: Mackenzie Dean, female    DOB: [birth year]  Age: 64 y.o. MRN: [id number]  The patient is here for annual Medicare wellness examination and management of other chronic and acute problems.     History of BRCA 2002 .  Ongoing surveillance  Has been mammograms alternating with MRis.  Last MRI was in 2015.   Activities of daily living:  The patient is 100% independent in all ADLs: dressing, toileting, feeding as well as independent mobility  Home safety : The patient has smoke detectors in the home. They wear seatbelts.  There are no firearms at home. There is no violence in the home.   There is no risks for hepatitis, STDs or HIV. There is no   history of blood transfusion. They have no travel history to infectious disease endemic areas of the world.  The patient has seen their dentist in the last six month. They have seen their eye doctor in the last year. They admit to slight hearing difficulty with regard to whispered voices and some television programs.  They have deferred audiologic testing in the last year.  They do not  have excessive sun exposure. Discussed the need for sun protection: hats, long sleeves and use of sunscreen if there is significant sun exposure.   Diet: the importance of a healthy diet is discussed. They do have a healthy diet.  The benefits of regular aerobic exercise were discussed. She walks 4 times per week ,  20 minutes.   Depression screen: there are no signs or vegative symptoms of depression- irritability, change in appetite, anhedonia, sadness/tearfullness.  Cognitive assessment: the patient manages all their financial and personal affairs and is actively engaged. They could relate day,date,year and events; recalled 2/3 objects at 3 minutes; performed clock-face test normally.  The following portions of the patient's history were reviewed and updated as appropriate: allergies, current medications, past family history, past medical history,  past  surgical history, past social history  and problem list.  Visual acuity was not assessed per patient preference since she has regular follow up with her ophthalmologist. Hearing and body mass index were assessed and reviewed.   During the course of the visit the patient was educated and counseled about appropriate screening and preventive services including : fall prevention , diabetes screening, nutrition counseling, colorectal cancer screening, and recommended immunizations.    CC: The primary encounter diagnosis was Screen for STD (sexually transmitted disease). Diagnoses of Vitamin D deficiency, Other fatigue, Hyperlipidemia, Need for hepatitis C screening test, Screening for osteoporosis, Screening for breast cancer, Cervical cancer screening, Visit for preventive health examination, Thoracic aortic atherosclerosis, Other chest pain, and History of breast cancer in female were also pertinent to this visit.   Feels she needs to be on a statin due to atherosclerosis of the aorta noted  last year on cardiac CT  Prior trial  Of statin was  In 2002 concurrent with tamoxifen and was not tolerated.   History Mackenzie Dean has a past medical history of Cancer (2001); Chicken pox; Allergy; Hyperlipidemia; and Heart murmur.   She has past surgical history that includes Breast surgery (Left, 2002) and Dilation and curettage of uterus.   Her family history includes Arrhythmia in her mother; Dementia in her mother; Heart disease in her father and mother; Heart failure in her cousin and paternal aunt; Mental retardation in her mother; Stroke in her father.She reports that she has been passively smoking.  She has never used smokeless tobacco. She reports that  she drinks about 0.6 oz of alcohol per week. She reports that she does not use illicit drugs.  Outpatient Prescriptions Prior to Visit  Medication Sig Dispense Refill  . aspirin 81 MG tablet Take 81 mg by mouth daily.    . calcium-vitamin D (OSCAL WITH D)  500-200 MG-UNIT per tablet Take 1 tablet by mouth daily with breakfast.    . cetirizine (ZYRTEC) 10 MG tablet Take 10 mg by mouth daily.    . clotrimazole-betamethasone (LOTRISONE) lotion Apply 1 application topically 2 (two) times daily.    Marland Kitchen estradiol (ESTRACE) 0.1 MG/GM vaginal cream Place 1 Applicatorful vaginally once a week.    . Multiple Vitamins-Minerals (MULTIVITAMIN PO) Take 1 capsule by mouth daily.    . Omega-3 Fatty Acids (FISH OIL) 1200 MG CAPS Take 1 capsule by mouth 2 (two) times daily.    . raloxifene (EVISTA) 60 MG tablet Take 60 mg by mouth daily.    Marland Kitchen albuterol (PROVENTIL HFA;VENTOLIN HFA) 108 (90 BASE) MCG/ACT inhaler Inhale 2 puffs into the lungs every 6 (six) hours as needed for wheezing or shortness of breath. (Patient not taking: Reported on 06/24/2015) 1 Inhaler 0   No facility-administered medications prior to visit.    Review of Systems   Patient denies headache, fevers, malaise, unintentional weight loss, skin rash, eye pain, sinus congestion and sinus pain, sore throat, dysphagia,  hemoptysis , cough, dyspnea, wheezing, chest pain, palpitations, orthopnea, edema, abdominal pain, nausea, melena, diarrhea, constipation, flank pain, dysuria, hematuria, urinary  Frequency, nocturia, numbness, tingling, seizures,  Focal weakness, Loss of consciousness,  Tremor, insomnia, depression, anxiety, and suicidal ideation.     Objective:  BP 104/64 mmHg  Pulse 71  Temp(Src) 98.1 F (36.7 C) (Oral)  Resp 14  Ht 5' 4"  (1.626 m)  Wt 117 lb 8 oz (53.298 kg)  BMI 20.16 kg/m2  SpO2 98%  Physical Exam   General Appearance:    Alert, cooperative, no distress, appears stated age  Head:    Normocephalic, without obvious abnormality, atraumatic  Eyes:    PERRL, conjunctiva/corneas clear, EOM's intact, fundi    benign, both eyes  Ears:    Normal TM's and external ear canals, both ears  Nose:   Nares normal, septum midline, mucosa normal, no drainage    or sinus tenderness   Throat:   Lips, mucosa, and tongue normal; teeth and gums normal  Neck:   Supple, symmetrical, trachea midline, no adenopathy;    thyroid:  no enlargement/tenderness/nodules; no carotid   bruit or JVD  Back:     Symmetric, no curvature, ROM normal, no CVA tenderness  Lungs:     Clear to auscultation bilaterally, respirations unlabored  Chest Wall:    No tenderness or deformity   Heart:    Regular rate and rhythm, S1 and S2 normal, no murmur, rub   or gallop  Breast Exam:    No tenderness, masses, or nipple abnormality  Abdomen:     Soft, non-tender, bowel sounds active all four quadrants,    no masses, no organomegaly  Genitalia:    Pelvic: cervix normal in appearance, external genitalia normal, no adnexal masses or tenderness, no cervical motion tenderness, rectovaginal septum normal, uterus normal size, shape, and consistency and vagina normal without discharge  Extremities:   Extremities normal, atraumatic, no cyanosis or edema  Pulses:   2+ and symmetric all extremities  Skin:   Skin color, texture, turgor normal, no rashes or lesions  Lymph nodes:   Cervical, supraclavicular,  and axillary nodes normal  Neurologic:   CNII-XII intact, normal strength, sensation and reflexes    throughout      Assessment & Plan:   Problem List Items Addressed This Visit      Unprioritized   History of breast cancer in female    Continue  annual screening with mammograms,  And ewvery other year MRIS at [facility]      Chest pain    Atypical,  Calcium coronary score was zero in August 2015.       Visit for preventive health examination    Annual wellness  exam was done as well as a comprehensive physical exam and management of acute and chronic conditions .  During the course of the visit the patient was educated and counseled about appropriate screening and preventive services including :  diabetes screening, lipid analysis with projected  10 year  risk for CAD , nutrition counseling, colorectal  cancer screening, and recommended immunizations.  Printed recommendations for health maintenance screenings was given.         Thoracic aortic atherosclerosis    Mild, by cardiac CT done August 2015.  Cardiac score was zero.  She feels compelled to start statin therapy given these finding,s but has a history of statin intolerance in 2002 .  Recommended deferrring until  Lipid panel can be repeated.        Other Visit Diagnoses    Screen for STD (sexually transmitted disease)    -  Primary    Relevant Orders    HIV antibody    Vitamin D deficiency        Relevant Orders    Vit D  25 hydroxy (rtn osteoporosis monitoring)    Other fatigue        Relevant Orders    CBC with Differential/Platelet    Comprehensive metabolic panel    TSH    Hyperlipidemia        Relevant Orders    Lipid panel    Need for hepatitis C screening test        Relevant Orders    Hepatitis C antibody    Screening for osteoporosis        Relevant Orders    DG Bone Density    Screening for breast cancer        Relevant Orders    MM DIGITAL SCREENING BILATERAL    Cervical cancer screening        Relevant Orders    Cytology - PAP       I have discontinued Ms. Hagwood's albuterol. I am also having her maintain her cetirizine, raloxifene, Fish Oil, estradiol, calcium-vitamin D, aspirin, Multiple Vitamins-Minerals (MULTIVITAMIN PO), and clotrimazole-betamethasone.  No orders of the defined types were placed in this encounter.    Medications Discontinued During This Encounter  Medication Reason  . albuterol (PROVENTIL HFA;VENTOLIN HFA) 108 (90 BASE) MCG/ACT inhaler Patient Preference   A total of 60 minutes was spent with patient more than half of which was spent in counseling patient on the above mentioned issues , reviewing and explaining recent labs and imaging studies done, and coordination of care.  Follow-up: No Follow-up on file.   Crecencio Mc, MD

## 2015-06-25 ENCOUNTER — Encounter: Payer: Self-pay | Admitting: Internal Medicine

## 2015-06-25 DIAGNOSIS — I700 Atherosclerosis of aorta: Secondary | ICD-10-CM | POA: Insufficient documentation

## 2015-06-25 NOTE — Assessment & Plan Note (Addendum)
Mild, by cardiac CT done August 2015.  Cardiac score was zero.  She feels compelled to start statin therapy given these finding,s but has a history of statin intolerance in 2002 .  Recommended deferrring until  Lipid panel can be repeated.

## 2015-06-25 NOTE — Assessment & Plan Note (Signed)
Atypical,  Calcium coronary score was zero in August 2015.

## 2015-06-25 NOTE — Assessment & Plan Note (Signed)
Continue  annual screening with mammograms,  And ewvery other year MRIS at [facility]

## 2015-06-25 NOTE — Assessment & Plan Note (Signed)

## 2015-06-26 LAB — CYTOLOGY - PAP

## 2015-06-28 ENCOUNTER — Encounter: Payer: Self-pay | Admitting: Internal Medicine

## 2015-07-02 ENCOUNTER — Other Ambulatory Visit: Payer: Self-pay

## 2015-07-02 NOTE — Telephone Encounter (Signed)
Please advise 

## 2015-07-03 MED ORDER — ESTRADIOL 0.1 MG/GM VA CREA: 1.0000 | TOPICAL_CREAM | VAGINAL | Status: DC

## 2015-07-03 NOTE — Telephone Encounter (Signed)
Ok to refill,  Refill sent  

## 2015-07-15 ENCOUNTER — Other Ambulatory Visit (INDEPENDENT_AMBULATORY_CARE_PROVIDER_SITE_OTHER): Payer: 59

## 2015-07-15 DIAGNOSIS — E785 Hyperlipidemia, unspecified: Secondary | ICD-10-CM | POA: Diagnosis not present

## 2015-07-15 DIAGNOSIS — Z113 Encounter for screening for infections with a predominantly sexual mode of transmission: Secondary | ICD-10-CM

## 2015-07-15 DIAGNOSIS — E559 Vitamin D deficiency, unspecified: Secondary | ICD-10-CM

## 2015-07-15 DIAGNOSIS — Z1159 Encounter for screening for other viral diseases: Secondary | ICD-10-CM

## 2015-07-15 DIAGNOSIS — R5383 Other fatigue: Secondary | ICD-10-CM | POA: Diagnosis not present

## 2015-07-15 LAB — CBC WITH DIFFERENTIAL/PLATELET
Basophils Absolute: 0.1 10*3/uL (ref 0.0–0.1)
Basophils Relative: 1.1 % (ref 0.0–3.0)
EOS ABS: 0.3 10*3/uL (ref 0.0–0.7)
EOS PCT: 6.6 % — AB (ref 0.0–5.0)
HEMATOCRIT: 42.9 % (ref 36.0–46.0)
HEMOGLOBIN: 14.9 g/dL (ref 12.0–15.0)
LYMPHS PCT: 29.5 % (ref 12.0–46.0)
Lymphs Abs: 1.4 10*3/uL (ref 0.7–4.0)
MCHC: 34.7 g/dL (ref 30.0–36.0)
MCV: 93 fl (ref 78.0–100.0)
Monocytes Absolute: 0.2 10*3/uL (ref 0.1–1.0)
Monocytes Relative: 4.5 % (ref 3.0–12.0)
Neutro Abs: 2.8 10*3/uL (ref 1.4–7.7)
Neutrophils Relative %: 58.3 % (ref 43.0–77.0)
Platelets: 197 10*3/uL (ref 150.0–400.0)
RBC: 4.62 Mil/uL (ref 3.87–5.11)
RDW: 12.9 % (ref 11.5–15.5)
WBC: 4.7 10*3/uL (ref 4.0–10.5)

## 2015-07-15 LAB — COMPREHENSIVE METABOLIC PANEL
ALBUMIN: 4.5 g/dL (ref 3.5–5.2)
ALT: 16 U/L (ref 0–35)
AST: 23 U/L (ref 0–37)
Alkaline Phosphatase: 42 U/L (ref 39–117)
BUN: 15 mg/dL (ref 6–23)
CALCIUM: 9.3 mg/dL (ref 8.4–10.5)
CO2: 29 mEq/L (ref 19–32)
CREATININE: 0.82 mg/dL (ref 0.40–1.20)
Chloride: 102 mEq/L (ref 96–112)
GFR: 75.15 mL/min (ref >60.00)
Glucose, Bld: 69 mg/dL — ABNORMAL LOW (ref 70–99)
Potassium: 3.6 mEq/L (ref 3.5–5.1)
SODIUM: 141 meq/L (ref 135–145)
Total Bilirubin: 0.6 mg/dL (ref 0.2–1.2)
Total Protein: 7.3 g/dL (ref 6.0–8.3)

## 2015-07-15 LAB — VITAMIN D 25 HYDROXY (VIT D DEFICIENCY, FRACTURES): VITD: 59.48 ng/mL (ref 30.00–100.00)

## 2015-07-15 LAB — LIPID PANEL
Cholesterol: 200 mg/dL (ref 0–200)
HDL: 60 mg/dL (ref >39.00)
LDL Cholesterol: 125 mg/dL — ABNORMAL HIGH (ref 0–99)
NONHDL: 139.87
Total CHOL/HDL Ratio: 3
Triglycerides: 73 mg/dL (ref 0.0–149.0)
VLDL: 14.6 mg/dL (ref 0.0–40.0)

## 2015-07-15 LAB — TSH: TSH: 1.12 u[IU]/mL (ref 0.35–4.50)

## 2015-07-16 LAB — HEPATITIS C ANTIBODY: HCV Ab: NEGATIVE

## 2015-07-16 LAB — HIV ANTIBODY (ROUTINE TESTING W REFLEX): HIV 1&2 Ab, 4th Generation: NONREACTIVE

## 2015-07-18 ENCOUNTER — Encounter: Payer: Self-pay | Admitting: Internal Medicine

## 2015-08-15 ENCOUNTER — Ambulatory Visit
Admission: RE | Admit: 2015-08-15 | Discharge: 2015-08-15 | Disposition: A | Discharge status: Home / Self Care | Payer: 59 | Source: Ambulatory Visit | Attending: Internal Medicine | Admitting: Internal Medicine

## 2015-08-15 ENCOUNTER — Other Ambulatory Visit: Payer: Self-pay | Admitting: Internal Medicine

## 2015-08-15 DIAGNOSIS — Z853 Personal history of malignant neoplasm of breast: Secondary | ICD-10-CM | POA: Diagnosis not present

## 2015-08-15 DIAGNOSIS — Z1231 Encounter for screening mammogram for malignant neoplasm of breast: Secondary | ICD-10-CM | POA: Diagnosis present

## 2015-08-15 DIAGNOSIS — Z1239 Encounter for other screening for malignant neoplasm of breast: Secondary | ICD-10-CM

## 2015-08-15 DIAGNOSIS — M8580 Other specified disorders of bone density and structure, unspecified site: Secondary | ICD-10-CM | POA: Diagnosis not present

## 2015-08-15 DIAGNOSIS — Z13820 Encounter for screening for osteoporosis: Secondary | ICD-10-CM | POA: Diagnosis present

## 2015-08-15 HISTORY — DX: Malignant neoplasm of unspecified site of unspecified female breast: C50.919

## 2015-08-15 LAB — HM DEXA SCAN

## 2015-08-18 ENCOUNTER — Encounter: Payer: Self-pay | Admitting: Internal Medicine

## 2015-12-16 ENCOUNTER — Encounter: Payer: Self-pay | Admitting: Internal Medicine

## 2015-12-17 ENCOUNTER — Other Ambulatory Visit: Payer: Self-pay | Admitting: *Deleted

## 2015-12-17 MED ORDER — RALOXIFENE HCL 60 MG PO TABS: 60.0000 mg | ORAL_TABLET | Freq: Every day | ORAL | Status: DC

## 2015-12-17 NOTE — Progress Notes (Signed)
Refill sent as requested. 

## 2016-04-01 DIAGNOSIS — Z85828 Personal history of other malignant neoplasm of skin: Secondary | ICD-10-CM | POA: Diagnosis not present

## 2016-04-01 DIAGNOSIS — D229 Melanocytic nevi, unspecified: Secondary | ICD-10-CM | POA: Diagnosis not present

## 2016-04-01 DIAGNOSIS — Z1283 Encounter for screening for malignant neoplasm of skin: Secondary | ICD-10-CM | POA: Diagnosis not present

## 2016-04-01 DIAGNOSIS — L821 Other seborrheic keratosis: Secondary | ICD-10-CM | POA: Diagnosis not present

## 2016-04-01 DIAGNOSIS — D1801 Hemangioma of skin and subcutaneous tissue: Secondary | ICD-10-CM | POA: Diagnosis not present

## 2016-04-01 DIAGNOSIS — L578 Other skin changes due to chronic exposure to nonionizing radiation: Secondary | ICD-10-CM | POA: Diagnosis not present

## 2016-04-01 DIAGNOSIS — L820 Inflamed seborrheic keratosis: Secondary | ICD-10-CM | POA: Diagnosis not present

## 2016-04-01 DIAGNOSIS — L812 Freckles: Secondary | ICD-10-CM | POA: Diagnosis not present

## 2016-06-24 ENCOUNTER — Ambulatory Visit: Payer: 59 | Admitting: Internal Medicine

## 2016-08-17 ENCOUNTER — Ambulatory Visit (INDEPENDENT_AMBULATORY_CARE_PROVIDER_SITE_OTHER): Payer: 59 | Admitting: Internal Medicine

## 2016-08-17 VITALS — BP 104/62 | HR 103 | Temp 98.0°F | Resp 12 | Ht 64.0 in | Wt 120.2 lb

## 2016-08-17 DIAGNOSIS — R5383 Other fatigue: Secondary | ICD-10-CM

## 2016-08-17 DIAGNOSIS — E559 Vitamin D deficiency, unspecified: Secondary | ICD-10-CM | POA: Diagnosis not present

## 2016-08-17 DIAGNOSIS — E785 Hyperlipidemia, unspecified: Secondary | ICD-10-CM

## 2016-08-17 DIAGNOSIS — Z853 Personal history of malignant neoplasm of breast: Secondary | ICD-10-CM | POA: Diagnosis not present

## 2016-08-17 DIAGNOSIS — Z0000 Encounter for general adult medical examination without abnormal findings: Secondary | ICD-10-CM

## 2016-08-17 MED ORDER — ESTRADIOL 0.1 MG/GM VA CREA: 1.0000 | TOPICAL_CREAM | VAGINAL | 4 refills | Status: DC

## 2016-08-17 MED ORDER — RALOXIFENE HCL 60 MG PO TABS: 60.0000 mg | ORAL_TABLET | Freq: Every day | ORAL | 1 refills | Status: DC

## 2016-08-17 NOTE — Progress Notes (Signed)
Pre-visit discussion using our clinic review tool. No additional management support is needed unless otherwise documented below in the visit note.  

## 2016-08-17 NOTE — Patient Instructions (Signed)
MAMMOGRAM AND MRI OF BOTH  BREASTS HAS BEEN  ORDERED  Please ask Jefm Bryant for copies of recent colonoscopies  RTC for fasting labs  Menopause is a normal process in which your reproductive ability comes to an end. This process happens gradually over a span of months to years, usually between the ages of 78 and 27. Menopause is complete when you have missed 12 consecutive menstrual periods. It is important to talk with your health care provider about some of the most common conditions that affect postmenopausal women, such as heart disease, cancer, and bone loss (osteoporosis). Adopting a healthy lifestyle and getting preventive care can help to promote your health and wellness. Those actions can also lower your chances of developing some of these common conditions. WHAT SHOULD I KNOW ABOUT MENOPAUSE? During menopause, you may experience a number of symptoms, such as:  Moderate-to-severe hot flashes.  Night sweats.  Decrease in sex drive.  Mood swings.  Headaches.  Tiredness.  Irritability.  Memory problems.  Insomnia. Choosing to treat or not to treat menopausal changes is an individual decision that you make with your health care provider. WHAT SHOULD I KNOW ABOUT HORMONE REPLACEMENT THERAPY AND SUPPLEMENTS? Hormone therapy products are effective for treating symptoms that are associated with menopause, such as hot flashes and night sweats. Hormone replacement carries certain risks, especially as you become older. If you are thinking about using estrogen or estrogen with progestin treatments, discuss the benefits and risks with your health care provider. WHAT SHOULD I KNOW ABOUT HEART DISEASE AND STROKE? Heart disease, heart attack, and stroke become more likely as you age. This may be due, in part, to the hormonal changes that your body experiences during menopause. These can affect how your body processes dietary fats, triglycerides, and cholesterol. Heart attack and stroke are both  medical emergencies. There are many things that you can do to help prevent heart disease and stroke:  Have your blood pressure checked at least every 1-2 years. High blood pressure causes heart disease and increases the risk of stroke.  If you are 44-49 years old, ask your health care provider if you should take aspirin to prevent a heart attack or a stroke.  Do not use any tobacco products, including cigarettes, chewing tobacco, or electronic cigarettes. If you need help quitting, ask your health care provider.  It is important to eat a healthy diet and maintain a healthy weight.  Be sure to include plenty of vegetables, fruits, low-fat dairy products, and lean protein.  Avoid eating foods that are high in solid fats, added sugars, or salt (sodium).  Get regular exercise. This is one of the most important things that you can do for your health.  Try to exercise for at least 150 minutes each week. The type of exercise that you do should increase your heart rate and make you sweat. This is known as moderate-intensity exercise.  Try to do strengthening exercises at least twice each week. Do these in addition to the moderate-intensity exercise.  Know your numbers.Ask your health care provider to check your cholesterol and your blood glucose. Continue to have your blood tested as directed by your health care provider. WHAT SHOULD I KNOW ABOUT CANCER SCREENING? There are several types of cancer. Take the following steps to reduce your risk and to catch any cancer development as early as possible. Breast Cancer  Practice breast self-awareness.  This means understanding how your breasts normally appear and feel.  It also means doing regular breast  self-exams. Let your health care provider know about any changes, no matter how small.  If you are 63 or older, have a clinician do a breast exam (clinical breast exam or CBE) every year. Depending on your age, family history, and medical history,  it may be recommended that you also have a yearly breast X-ray (mammogram).  If you have a family history of breast cancer, talk with your health care provider about genetic screening.  If you are at high risk for breast cancer, talk with your health care provider about having an MRI and a mammogram every year.  Breast cancer (BRCA) gene test is recommended for women who have family members with BRCA-related cancers. Results of the assessment will determine the need for genetic counseling and BRCA1 and for BRCA2 testing. BRCA-related cancers include these types:  Breast. This occurs in males or females.  Ovarian.  Tubal. This may also be called fallopian tube cancer.  Cancer of the abdominal or pelvic lining (peritoneal cancer).  Prostate.  Pancreatic. Cervical, Uterine, and Ovarian Cancer Your health care provider may recommend that you be screened regularly for cancer of the pelvic organs. These include your ovaries, uterus, and vagina. This screening involves a pelvic exam, which includes checking for microscopic changes to the surface of your cervix (Pap test).  For women ages 21-65, health care providers may recommend a pelvic exam and a Pap test every three years. For women ages 43-65, they may recommend the Pap test and pelvic exam, combined with testing for human papilloma virus (HPV), every five years. Some types of HPV increase your risk of cervical cancer. Testing for HPV may also be done on women of any age who have unclear Pap test results.  Other health care providers may not recommend any screening for nonpregnant women who are considered low risk for pelvic cancer and have no symptoms. Ask your health care provider if a screening pelvic exam is right for you.  If you have had past treatment for cervical cancer or a condition that could lead to cancer, you need Pap tests and screening for cancer for at least 20 years after your treatment. If Pap tests have been discontinued  for you, your risk factors (such as having a new sexual partner) need to be reassessed to determine if you should start having screenings again. Some women have medical problems that increase the chance of getting cervical cancer. In these cases, your health care provider may recommend that you have screening and Pap tests more often.  If you have a family history of uterine cancer or ovarian cancer, talk with your health care provider about genetic screening.  If you have vaginal bleeding after reaching menopause, tell your health care provider.  There are currently no reliable tests available to screen for ovarian cancer. Lung Cancer Lung cancer screening is recommended for adults 62-32 years old who are at high risk for lung cancer because of a history of smoking. A yearly low-dose CT scan of the lungs is recommended if you:  Currently smoke.  Have a history of at least 30 pack-years of smoking and you currently smoke or have quit within the past 15 years. A pack-year is smoking an average of one pack of cigarettes per day for one year. Yearly screening should:  Continue until it has been 15 years since you quit.  Stop if you develop a health problem that would prevent you from having lung cancer treatment. Colorectal Cancer  This type of cancer can  be detected and can often be prevented.  Routine colorectal cancer screening usually begins at age 35 and continues through age 16.  If you have risk factors for colon cancer, your health care provider may recommend that you be screened at an earlier age.  If you have a family history of colorectal cancer, talk with your health care provider about genetic screening.  Your health care provider may also recommend using home test kits to check for hidden blood in your stool.  A small camera at the end of a tube can be used to examine your colon directly (sigmoidoscopy or colonoscopy). This is done to check for the earliest forms of  colorectal cancer.  Direct examination of the colon should be repeated every 5-10 years until age 67. However, if early forms of precancerous polyps or small growths are found or if you have a family history or genetic risk for colorectal cancer, you may need to be screened more often. Skin Cancer  Check your skin from head to toe regularly.  Monitor any moles. Be sure to tell your health care provider:  About any new moles or changes in moles, especially if there is a change in a mole's shape or color.  If you have a mole that is larger than the size of a pencil eraser.  If any of your family members has a history of skin cancer, especially at a young age, talk with your health care provider about genetic screening.  Always use sunscreen. Apply sunscreen liberally and repeatedly throughout the day.  Whenever you are outside, protect yourself by wearing long sleeves, pants, a wide-brimmed hat, and sunglasses. WHAT SHOULD I KNOW ABOUT OSTEOPOROSIS? Osteoporosis is a condition in which bone destruction happens more quickly than new bone creation. After menopause, you may be at an increased risk for osteoporosis. To help prevent osteoporosis or the bone fractures that can happen because of osteoporosis, the following is recommended:  If you are 85-74 years old, get at least 1,000 mg of calcium and at least 600 mg of vitamin D per day.  If you are older than age 45 but younger than age 64, get at least 1,200 mg of calcium and at least 600 mg of vitamin D per day.  If you are older than age 97, get at least 1,200 mg of calcium and at least 800 mg of vitamin D per day. Smoking and excessive alcohol intake increase the risk of osteoporosis. Eat foods that are rich in calcium and vitamin D, and do weight-bearing exercises several times each week as directed by your health care provider. WHAT SHOULD I KNOW ABOUT HOW MENOPAUSE AFFECTS [place]? Depression may occur at any age, but it is  more common as you become older. Common symptoms of depression include:  Low or sad mood.  Changes in sleep patterns.  Changes in appetite or eating patterns.  Feeling an overall lack of motivation or enjoyment of activities that you previously enjoyed.  Frequent crying spells. Talk with your health care provider if you think that you are experiencing depression. WHAT SHOULD I KNOW ABOUT IMMUNIZATIONS? It is important that you get and maintain your immunizations. These include:  Tetanus, diphtheria, and pertussis (Tdap) booster vaccine.  Influenza every year before the flu season begins.  Pneumonia vaccine.  Shingles vaccine. Your health care provider may also recommend other immunizations.   This information is not intended to replace advice given to you by your health care provider. Make sure you discuss  any questions you have with your health care provider.   Document Released: 12/25/2005 Document Revised: 11/23/2014 Document Reviewed: 07/05/2014 Elsevier Interactive Patient Education [employer].

## 2016-08-17 NOTE — Progress Notes (Signed)
Patient ID: Mackenzie Dean, female    DOB: 1951-02-25  Age: 65 y.o. MRN: [id number]  The patient is here for annual  Non gyn  wellness examination and management of other chronic and acute problems.  PAP 2016: atrophy atherosclelrosis left BRCA in 2001 : screening normal Sept 2016 [place] [facility]:  By Mackenzie Dean, since 2013.   Using topical estrace sparingly for vaginitis Needs Evista   Taking Vit d with Calcium Mammogram and MRI breast  To be done Done at [facility]    The risk factors are reflected in the social history.  The roster of all physicians providing medical care to patient - is listed in the Snapshot section of the chart.  Activities of daily living:  The patient is 100% independent in all ADLs: dressing, toileting, feeding as well as independent mobility  Home safety : The patient has smoke detectors in the home. They wear seatbelts.  There are no firearms at home. There is no violence in the home.   There is no risks for hepatitis, STDs or HIV. There is no   history of blood transfusion. They have no travel history to infectious disease endemic areas of the world.  The patient has seen their dentist in the last six month. They have seen their eye doctor in the last year. They admit to slight hearing difficulty with regard to whispered voices and some television programs.  They have deferred audiologic testing in the last year.  They do not  have excessive sun exposure. Discussed the need for sun protection: hats, long sleeves and use of sunscreen if there is significant sun exposure.   Diet: the importance of a healthy diet is discussed. They do have a healthy diet.  The benefits of regular aerobic exercise were discussed. She walks 4 times per week ,  20 minutes.   Depression screen: there are no signs or vegative symptoms of depression- irritability, change in appetite, anhedonia, sadness/tearfullness.  The following portions of the patient's history were reviewed and  updated as appropriate: allergies, current medications, past family history, past medical history,  past surgical history, past social history  and problem list.  Visual acuity was not assessed per patient preference since she has regular follow up with her ophthalmologist. Hearing and body mass index were assessed and reviewed.   During the course of the visit the patient was educated and counseled about appropriate screening and preventive services including : fall prevention , diabetes screening, nutrition counseling, colorectal cancer screening, and recommended immunizations.    CC: The primary encounter diagnosis was History of breast cancer in adulthood. Diagnoses of Vitamin D deficiency, Fatigue, unspecified type, Hyperlipidemia LDL goal <160, History of breast cancer in female, and Visit for preventive health examination were also pertinent to this visit.  History Mackenzie Dean has a past medical history of Allergy; Breast cancer (2001); Cancer (2001); Chicken pox; Heart murmur; and Hyperlipidemia.   She has a past surgical history that includes Breast surgery (Left, 2002); Dilation and curettage of uterus; Breast biopsy (Right, 2001); Breast biopsy (Left, 2001); and Breast lumpectomy (Left, 2001).   Her family history includes Arrhythmia in her mother; Breast cancer in her cousin and cousin; Dementia in her mother; Heart disease in her father and mother; Heart failure in her paternal aunt; Mental retardation in her mother; Stroke in her father.She reports that she is a non-smoker but has been exposed to tobacco smoke. She has never used smokeless tobacco. She reports that she drinks about 0.6 oz of  alcohol per week . She reports that she does not use drugs.  Outpatient Medications Prior to Visit  Medication Sig Dispense Refill  . aspirin 81 MG tablet Take 81 mg by mouth daily.    . calcium-vitamin D (OSCAL WITH D) 500-200 MG-UNIT per tablet Take 1 tablet by mouth daily with breakfast.    .  cetirizine (ZYRTEC) 10 MG tablet Take 10 mg by mouth daily.    . clotrimazole-betamethasone (LOTRISONE) lotion Apply 1 application topically 2 (two) times daily.    . Multiple Vitamins-Minerals (MULTIVITAMIN PO) Take 1 capsule by mouth daily.    . Omega-3 Fatty Acids (FISH OIL) 1200 MG CAPS Take 1 capsule by mouth 2 (two) times daily.    Marland Kitchen estradiol (ESTRACE) 0.1 MG/GM vaginal cream Place 1 Applicatorful vaginally 2 (two) times a week. 42.5 g 4  . raloxifene (EVISTA) 60 MG tablet Take 1 tablet (60 mg total) by mouth daily. 90 tablet 1   No facility-administered medications prior to visit.     Review of Systems   Patient denies headache, fevers, malaise, unintentional weight loss, skin rash, eye pain, sinus congestion and sinus pain, sore throat, dysphagia,  hemoptysis , cough, dyspnea, wheezing, chest pain, palpitations, orthopnea, edema, abdominal pain, nausea, melena, diarrhea, constipation, flank pain, dysuria, hematuria, urinary  Frequency, nocturia, numbness, tingling, seizures,  Focal weakness, Loss of consciousness,  Tremor, insomnia, depression, anxiety, and suicidal ideation.      Objective:  BP 104/62   Pulse (!) 103   Temp 98 F (36.7 C) (Oral)   Resp 12   Ht 5' 4"  (1.626 m)   Wt 120 lb 4 oz (54.5 kg)   SpO2 98%   BMI 20.64 kg/m   Physical Exam   General appearance: alert, cooperative and appears stated age Head: Normocephalic, without obvious abnormality, atraumatic Eyes: conjunctivae/corneas clear. PERRL, EOM's intact. Fundi benign. Ears: normal TM's and external ear canals both ears Nose: Nares normal. Septum midline. Mucosa normal. No drainage or sinus tenderness. Throat: lips, mucosa, and tongue normal; teeth and gums normal Neck: no adenopathy, no carotid bruit, no JVD, supple, symmetrical, trachea midline and thyroid not enlarged, symmetric, no tenderness/mass/nodules Lungs: clear to auscultation bilaterally Breasts: normal appearance, no masses or tenderness.  Left lumpectomy scar.  Heart: regular rate and rhythm, S1, S2 normal, no murmur, click, rub or gallop Abdomen: soft, non-tender; bowel sounds normal; no masses,  no organomegaly Extremities: extremities normal, atraumatic, no cyanosis or edema Pulses: 2+ and symmetric Skin: Skin color, texture, turgor normal. No rashes or lesions Neurologic: Alert and oriented X 3, normal strength and tone. Normal symmetric reflexes. Normal coordination and gait.     Assessment & Plan:   Problem List Items Addressed This Visit    History of breast cancer in female    MRI breasts and mammogram have been ordered.       Visit for preventive health examination    Annual comprehensive preventive exam was done as well as an evaluation and management of chronic conditions .  During the course of the visit the patient was educated and counseled about appropriate screening and preventive services including :  diabetes screening, lipid analysis with projected  10 year  risk for CAD  Which is 4.7 % using the Framingham risk calculator for women, , nutrition counseling, colorectal cancer screening, and recommended immunizations.  Printed recommendations for health maintenance screenings was given.         Other Visit Diagnoses    History of breast cancer  in adulthood    -  Primary   Relevant Orders   MR Breast Bilateral W Contrast   MM DIGITAL SCREENING BILATERAL   Vitamin D deficiency       Relevant Orders   VITAMIN D 25 Hydroxy (Vit-D Deficiency, Fractures)   Fatigue, unspecified type       Relevant Orders   TSH   CBC with Differential/Platelet   Comprehensive metabolic panel   Hyperlipidemia LDL goal <160       Relevant Orders   Lipid panel      I am having Ms. Frakes maintain her cetirizine, Fish Oil, calcium-vitamin D, aspirin, Multiple Vitamins-Minerals (MULTIVITAMIN PO), clotrimazole-betamethasone, raloxifene, and estradiol.  Meds ordered this encounter  Medications  . raloxifene (EVISTA)  60 MG tablet    Sig: Take 1 tablet (60 mg total) by mouth daily.    Dispense:  90 tablet    Refill:  1  . estradiol (ESTRACE) 0.1 MG/GM vaginal cream    Sig: Place 1 Applicatorful vaginally 2 (two) times a week.    Dispense:  42.5 g    Refill:  4    Medications Discontinued During This Encounter  Medication Reason  . raloxifene (EVISTA) 60 MG tablet Reorder  . estradiol (ESTRACE) 0.1 MG/GM vaginal cream Reorder    Follow-up: Return for fasting labs asap.   Crecencio Mc, MD

## 2016-08-18 NOTE — Assessment & Plan Note (Signed)
Annual comprehensive preventive exam was done as well as an evaluation and management of chronic conditions .  During the course of the visit the patient was educated and counseled about appropriate screening and preventive services including :  diabetes screening, lipid analysis with projected  10 year  risk for CAD  Which is 4.7 % using the Framingham risk calculator for women, , nutrition counseling, colorectal cancer screening, and recommended immunizations.  Printed recommendations for health maintenance screenings was given.  

## 2016-08-18 NOTE — Assessment & Plan Note (Signed)
MRI breasts and mammogram have been ordered.

## 2016-08-21 ENCOUNTER — Other Ambulatory Visit (INDEPENDENT_AMBULATORY_CARE_PROVIDER_SITE_OTHER): Payer: 59

## 2016-08-21 DIAGNOSIS — E559 Vitamin D deficiency, unspecified: Secondary | ICD-10-CM

## 2016-08-21 DIAGNOSIS — R5383 Other fatigue: Secondary | ICD-10-CM | POA: Diagnosis not present

## 2016-08-21 DIAGNOSIS — E785 Hyperlipidemia, unspecified: Secondary | ICD-10-CM

## 2016-08-21 LAB — COMPREHENSIVE METABOLIC PANEL
ALBUMIN: 4.1 g/dL (ref 3.5–5.2)
ALT: 19 U/L (ref 0–35)
AST: 25 U/L (ref 0–37)
Alkaline Phosphatase: 46 U/L (ref 39–117)
BILIRUBIN TOTAL: 0.7 mg/dL (ref 0.2–1.2)
BUN: 18 mg/dL (ref 6–23)
CALCIUM: 9.6 mg/dL (ref 8.4–10.5)
CO2: 30 mEq/L (ref 19–32)
CREATININE: 0.91 mg/dL (ref 0.40–1.20)
Chloride: 102 mEq/L (ref 96–112)
GFR: 66.4 mL/min (ref >60.00)
Glucose, Bld: 83 mg/dL (ref 70–99)
Potassium: 3.7 mEq/L (ref 3.5–5.1)
Sodium: 139 mEq/L (ref 135–145)
TOTAL PROTEIN: 7.4 g/dL (ref 6.0–8.3)

## 2016-08-21 LAB — CBC WITH DIFFERENTIAL/PLATELET
BASOS ABS: 0.1 10*3/uL (ref 0.0–0.1)
BASOS PCT: 1.3 % (ref 0.0–3.0)
EOS ABS: 0.4 10*3/uL (ref 0.0–0.7)
Eosinophils Relative: 7.6 % — ABNORMAL HIGH (ref 0.0–5.0)
HEMATOCRIT: 42.5 % (ref 36.0–46.0)
HEMOGLOBIN: 14.4 g/dL (ref 12.0–15.0)
LYMPHS PCT: 33.6 % (ref 12.0–46.0)
Lymphs Abs: 1.6 10*3/uL (ref 0.7–4.0)
MCHC: 33.8 g/dL (ref 30.0–36.0)
MCV: 91.5 fl (ref 78.0–100.0)
Monocytes Absolute: 0.3 10*3/uL (ref 0.1–1.0)
Monocytes Relative: 5.9 % (ref 3.0–12.0)
Neutro Abs: 2.5 10*3/uL (ref 1.4–7.7)
Neutrophils Relative %: 51.6 % (ref 43.0–77.0)
Platelets: 205 10*3/uL (ref 150.0–400.0)
RBC: 4.64 Mil/uL (ref 3.87–5.11)
RDW: 13.1 % (ref 11.5–15.5)
WBC: 4.8 10*3/uL (ref 4.0–10.5)

## 2016-08-21 LAB — VITAMIN D 25 HYDROXY (VIT D DEFICIENCY, FRACTURES): VITD: 60.01 ng/mL (ref 30.00–100.00)

## 2016-08-21 LAB — LIPID PANEL
Cholesterol: 206 mg/dL — ABNORMAL HIGH (ref 0–200)
HDL: 60.4 mg/dL
LDL Cholesterol: 132 mg/dL — ABNORMAL HIGH (ref 0–99)
NonHDL: 145.62
Total CHOL/HDL Ratio: 3
Triglycerides: 67 mg/dL (ref 0.0–149.0)
VLDL: 13.4 mg/dL (ref 0.0–40.0)

## 2016-08-21 LAB — TSH: TSH: 1.06 u[IU]/mL (ref 0.35–4.50)

## 2016-08-22 ENCOUNTER — Encounter: Payer: Self-pay | Admitting: Internal Medicine

## 2016-08-25 ENCOUNTER — Encounter: Payer: Self-pay | Admitting: Internal Medicine

## 2016-08-25 DIAGNOSIS — Z853 Personal history of malignant neoplasm of breast: Secondary | ICD-10-CM | POA: Diagnosis not present

## 2017-01-07 DIAGNOSIS — Z1231 Encounter for screening mammogram for malignant neoplasm of breast: Secondary | ICD-10-CM | POA: Diagnosis not present

## 2017-01-07 DIAGNOSIS — R140 Abdominal distension (gaseous): Secondary | ICD-10-CM | POA: Diagnosis not present

## 2017-01-07 DIAGNOSIS — R102 Pelvic and perineal pain: Secondary | ICD-10-CM | POA: Diagnosis not present

## 2017-01-08 DIAGNOSIS — H5203 Hypermetropia, bilateral: Secondary | ICD-10-CM | POA: Diagnosis not present

## 2017-01-12 ENCOUNTER — Other Ambulatory Visit: Payer: Self-pay | Admitting: Obstetrics and Gynecology

## 2017-01-12 DIAGNOSIS — Z1231 Encounter for screening mammogram for malignant neoplasm of breast: Secondary | ICD-10-CM

## 2017-02-02 DIAGNOSIS — R938 Abnormal findings on diagnostic imaging of other specified body structures: Secondary | ICD-10-CM | POA: Diagnosis not present

## 2017-02-02 DIAGNOSIS — R140 Abdominal distension (gaseous): Secondary | ICD-10-CM | POA: Diagnosis not present

## 2017-02-11 ENCOUNTER — Ambulatory Visit
Admission: RE | Admit: 2017-02-11 | Discharge: 2017-02-11 | Disposition: A | Discharge status: Home / Self Care | Payer: 59 | Source: Ambulatory Visit | Attending: Obstetrics and Gynecology | Admitting: Obstetrics and Gynecology

## 2017-02-11 DIAGNOSIS — Z1231 Encounter for screening mammogram for malignant neoplasm of breast: Secondary | ICD-10-CM | POA: Insufficient documentation

## 2017-03-09 ENCOUNTER — Other Ambulatory Visit: Payer: Self-pay | Admitting: Internal Medicine

## 2017-03-09 NOTE — Telephone Encounter (Signed)
Refilled: 08/17/16 Last OV: 08/17/16 Last Labs: 08/21/16 Future OV: none Please advise?

## 2017-03-16 ENCOUNTER — Telehealth: Payer: Self-pay

## 2017-03-16 NOTE — Telephone Encounter (Signed)
Raloxifene HCL 60mg  has been approved without expiration date. Both pt and pharmacy has been notified.   PA Reference Number: [id number]

## 2017-04-08 DIAGNOSIS — L820 Inflamed seborrheic keratosis: Secondary | ICD-10-CM | POA: Diagnosis not present

## 2017-04-08 DIAGNOSIS — L812 Freckles: Secondary | ICD-10-CM | POA: Diagnosis not present

## 2017-04-08 DIAGNOSIS — L578 Other skin changes due to chronic exposure to nonionizing radiation: Secondary | ICD-10-CM | POA: Diagnosis not present

## 2017-04-08 DIAGNOSIS — D1800 Hemangioma unspecified site: Secondary | ICD-10-CM | POA: Diagnosis not present

## 2017-04-08 DIAGNOSIS — Z85828 Personal history of other malignant neoplasm of skin: Secondary | ICD-10-CM | POA: Diagnosis not present

## 2017-04-08 DIAGNOSIS — L720 Epidermal cyst: Secondary | ICD-10-CM | POA: Diagnosis not present

## 2017-04-08 DIAGNOSIS — D485 Neoplasm of uncertain behavior of skin: Secondary | ICD-10-CM | POA: Diagnosis not present

## 2017-04-08 DIAGNOSIS — D229 Melanocytic nevi, unspecified: Secondary | ICD-10-CM | POA: Diagnosis not present

## 2017-04-08 DIAGNOSIS — Z1283 Encounter for screening for malignant neoplasm of skin: Secondary | ICD-10-CM | POA: Diagnosis not present

## 2017-04-08 DIAGNOSIS — L821 Other seborrheic keratosis: Secondary | ICD-10-CM | POA: Diagnosis not present

## 2017-08-09 DIAGNOSIS — L720 Epidermal cyst: Secondary | ICD-10-CM | POA: Diagnosis not present

## 2017-08-09 DIAGNOSIS — L739 Follicular disorder, unspecified: Secondary | ICD-10-CM | POA: Diagnosis not present

## 2017-09-24 ENCOUNTER — Emergency Department
Admission: EM | Admit: 2017-09-24 | Discharge: 2017-09-24 | Disposition: A | Discharge status: Home / Self Care | Payer: 59 | Attending: Emergency Medicine | Admitting: Emergency Medicine

## 2017-09-24 ENCOUNTER — Encounter: Payer: Self-pay | Admitting: Emergency Medicine

## 2017-09-24 ENCOUNTER — Emergency Department: Payer: 59

## 2017-09-24 DIAGNOSIS — Y999 Unspecified external cause status: Secondary | ICD-10-CM | POA: Diagnosis not present

## 2017-09-24 DIAGNOSIS — Z7982 Long term (current) use of aspirin: Secondary | ICD-10-CM | POA: Diagnosis not present

## 2017-09-24 DIAGNOSIS — Y93E2 Activity, laundry: Secondary | ICD-10-CM | POA: Insufficient documentation

## 2017-09-24 DIAGNOSIS — W34010A Accidental discharge of airgun, initial encounter: Secondary | ICD-10-CM | POA: Insufficient documentation

## 2017-09-24 DIAGNOSIS — S21131A Puncture wound without foreign body of right front wall of thorax without penetration into thoracic cavity, initial encounter: Secondary | ICD-10-CM

## 2017-09-24 DIAGNOSIS — Z7722 Contact with and (suspected) exposure to environmental tobacco smoke (acute) (chronic): Secondary | ICD-10-CM | POA: Diagnosis not present

## 2017-09-24 DIAGNOSIS — S20101A Unspecified superficial injuries of breast, right breast, initial encounter: Secondary | ICD-10-CM | POA: Diagnosis present

## 2017-09-24 DIAGNOSIS — Y92008 Other place in unspecified non-institutional (private) residence as the place of occurrence of the external cause: Secondary | ICD-10-CM | POA: Insufficient documentation

## 2017-09-24 DIAGNOSIS — Z79899 Other long term (current) drug therapy: Secondary | ICD-10-CM | POA: Diagnosis not present

## 2017-09-24 DIAGNOSIS — S21041A Puncture wound with foreign body of right breast, initial encounter: Secondary | ICD-10-CM | POA: Insufficient documentation

## 2017-09-24 DIAGNOSIS — R918 Other nonspecific abnormal finding of lung field: Secondary | ICD-10-CM | POA: Diagnosis not present

## 2017-09-24 DIAGNOSIS — S299XXA Unspecified injury of thorax, initial encounter: Secondary | ICD-10-CM | POA: Diagnosis not present

## 2017-09-24 MED ORDER — BACITRACIN ZINC 500 UNIT/GM EX OINT
TOPICAL_OINTMENT | Freq: Once | CUTANEOUS | Status: AC
  Administered 2017-09-24: 1 via TOPICAL
  Filled 2017-09-24: qty 28.35

## 2017-09-24 MED ORDER — CEPHALEXIN 500 MG PO CAPS
ORAL_CAPSULE | ORAL | Status: AC
  Administered 2017-09-24: 500 mg via ORAL
  Filled 2017-09-24: qty 1

## 2017-09-24 MED ORDER — LIDOCAINE-EPINEPHRINE 2 %-1:100000 IJ SOLN: 20.0000 mL | Freq: Once | INTRAMUSCULAR | Status: DC

## 2017-09-24 MED ORDER — CEPHALEXIN 500 MG PO CAPS: 500.0000 mg | ORAL_CAPSULE | Freq: Four times a day (QID) | ORAL | 0 refills | Status: DC

## 2017-09-24 MED ORDER — LIDOCAINE-EPINEPHRINE 1 %-1:100000 IJ SOLN
20.0000 mL | Freq: Once | INTRAMUSCULAR | Status: AC
  Administered 2017-09-24: 2 mL via INTRADERMAL
  Filled 2017-09-24: qty 20

## 2017-09-24 MED ORDER — CEPHALEXIN 500 MG PO CAPS
500.0000 mg | ORAL_CAPSULE | Freq: Once | ORAL | Status: AC
  Administered 2017-09-24: 500 mg via ORAL

## 2017-09-24 MED ORDER — LIDOCAINE HCL (PF) 1 % IJ SOLN: 5.0000 mL | Freq: Once | INTRAMUSCULAR | Status: DC

## 2017-09-24 NOTE — ED Notes (Signed)
Pt ambulatory at discharge and is in NAD at this time. Pt and husband verbalized understanding of discharge instructions, follow-up care and prescriptions. Pt A&O x4. Skin warm and dry. VSS.
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
## 2017-09-24 NOTE — Discharge Instructions (Signed)
Follow-up with Dr. Tamala Julian.  Use antibiotic as prescribed.  Return to the emergency room or call Dr. Thompson Caul office if you have any concerns or signs of infection.  Please return the emergency room right away should he experience bleeding, shortness of breath, chest pain, weakness, trouble breathing, or other new concerns arise.  Remove dressing on Saturday. Clean as needed with peroxide.  May shower as desired. Apply Neosporin ointment to both wounds. Apply smaller dressings and change once or twice a day as needed for drainage and change dressings daily until dry.
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
## 2017-09-30 DIAGNOSIS — D128 Benign neoplasm of rectum: Secondary | ICD-10-CM | POA: Diagnosis not present

## 2017-09-30 DIAGNOSIS — D129 Benign neoplasm of anus and anal canal: Secondary | ICD-10-CM | POA: Diagnosis not present

## 2017-09-30 DIAGNOSIS — Z86010 Personal history of colonic polyps: Secondary | ICD-10-CM | POA: Diagnosis not present

## 2017-09-30 DIAGNOSIS — K640 First degree hemorrhoids: Secondary | ICD-10-CM | POA: Diagnosis not present

## 2017-09-30 DIAGNOSIS — D126 Benign neoplasm of colon, unspecified: Secondary | ICD-10-CM | POA: Diagnosis not present

## 2017-09-30 DIAGNOSIS — K648 Other hemorrhoids: Secondary | ICD-10-CM | POA: Diagnosis not present

## 2017-09-30 DIAGNOSIS — K635 Polyp of colon: Secondary | ICD-10-CM | POA: Diagnosis not present

## 2017-09-30 LAB — HM COLONOSCOPY

## 2017-10-01 ENCOUNTER — Other Ambulatory Visit: Payer: Self-pay | Admitting: Internal Medicine

## 2017-10-01 NOTE — Telephone Encounter (Signed)
NO OV since 08/17/16 please advise to refill?

## 2017-10-18 ENCOUNTER — Other Ambulatory Visit: Payer: Self-pay | Admitting: Internal Medicine

## 2017-11-05 ENCOUNTER — Other Ambulatory Visit: Payer: 59

## 2017-11-10 ENCOUNTER — Encounter: Payer: 59 | Admitting: Internal Medicine

## 2017-11-17 ENCOUNTER — Ambulatory Visit (INDEPENDENT_AMBULATORY_CARE_PROVIDER_SITE_OTHER): Payer: 59 | Admitting: Internal Medicine

## 2017-11-17 ENCOUNTER — Encounter: Payer: Self-pay | Admitting: Internal Medicine

## 2017-11-17 VITALS — BP 108/70 | HR 71 | Temp 97.5°F | Resp 15 | Ht 64.0 in | Wt 120.8 lb

## 2017-11-17 DIAGNOSIS — R5383 Other fatigue: Secondary | ICD-10-CM | POA: Diagnosis not present

## 2017-11-17 DIAGNOSIS — Z13220 Encounter for screening for lipoid disorders: Secondary | ICD-10-CM

## 2017-11-17 DIAGNOSIS — I700 Atherosclerosis of aorta: Secondary | ICD-10-CM | POA: Diagnosis not present

## 2017-11-17 DIAGNOSIS — Z0001 Encounter for general adult medical examination with abnormal findings: Secondary | ICD-10-CM | POA: Diagnosis not present

## 2017-11-17 DIAGNOSIS — Z8739 Personal history of other diseases of the musculoskeletal system and connective tissue: Secondary | ICD-10-CM | POA: Diagnosis not present

## 2017-11-17 DIAGNOSIS — R0789 Other chest pain: Secondary | ICD-10-CM | POA: Diagnosis not present

## 2017-11-17 DIAGNOSIS — Z1239 Encounter for other screening for malignant neoplasm of breast: Secondary | ICD-10-CM

## 2017-11-17 DIAGNOSIS — Z0000 Encounter for general adult medical examination without abnormal findings: Secondary | ICD-10-CM

## 2017-11-17 MED ORDER — ZOSTER VAC RECOMB ADJUVANTED 50 MCG/0.5ML IM SUSR: 0.5000 mL | Freq: Once | INTRAMUSCULAR | 1 refills | Status: AC
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
## 2017-11-17 NOTE — Patient Instructions (Signed)
Return for fasting labs at your convenience   If your 10 yr risk of CAD  IS OVER 85 ,  I'll recommended a trial of low dose crestor    The ShingRx vaccine is now available in local pharmacies and is much more protective thant Zostavaxs,  It is therefore ADVISED for all interested adults over 50 to prevent shingles   Mammogram ordered to be done in March ; your last breast MRI was Oct 2017  You can either stop the aspirin or try reducing it to once weekly  I would continue Evista    Health Maintenance for Postmenopausal Women Menopause is a normal process in which your reproductive ability comes to an end. This process happens gradually over a span of months to years, usually between the ages of 44 and 71. Menopause is complete when you have missed 12 consecutive menstrual periods. It is important to talk with your health care provider about some of the most common conditions that affect postmenopausal women, such as heart disease, cancer, and bone loss (osteoporosis). Adopting a healthy lifestyle and getting preventive care can help to promote your health and wellness. Those actions can also lower your chances of developing some of these common conditions. What should I know about menopause? During menopause, you may experience a number of symptoms, such as:  Moderate-to-severe hot flashes.  Night sweats.  Decrease in sex drive.  Mood swings.  Headaches.  Tiredness.  Irritability.  Memory problems.  Insomnia.  Choosing to treat or not to treat menopausal changes is an individual decision that you make with your health care provider. What should I know about hormone replacement therapy and supplements? Hormone therapy products are effective for treating symptoms that are associated with menopause, such as hot flashes and night sweats. Hormone replacement carries certain risks, especially as you become older. If you are thinking about using estrogen or estrogen with progestin  treatments, discuss the benefits and risks with your health care provider. What should I know about heart disease and stroke? Heart disease, heart attack, and stroke become more likely as you age. This may be due, in part, to the hormonal changes that your body experiences during menopause. These can affect how your body processes dietary fats, triglycerides, and cholesterol. Heart attack and stroke are both medical emergencies. There are many things that you can do to help prevent heart disease and stroke:  Have your blood pressure checked at least every 1-2 years. High blood pressure causes heart disease and increases the risk of stroke.  If you are 75-52 years old, ask your health care provider if you should take aspirin to prevent a heart attack or a stroke.  Do not use any tobacco products, including cigarettes, chewing tobacco, or electronic cigarettes. If you need help quitting, ask your health care provider.  It is important to eat a healthy diet and maintain a healthy weight. ? Be sure to include plenty of vegetables, fruits, low-fat dairy products, and lean protein. ? Avoid eating foods that are high in solid fats, added sugars, or salt (sodium).  Get regular exercise. This is one of the most important things that you can do for your health. ? Try to exercise for at least 150 minutes each week. The type of exercise that you do should increase your heart rate and make you sweat. This is known as moderate-intensity exercise. ? Try to do strengthening exercises at least twice each week. Do these in addition to the moderate-intensity exercise.  Know  your numbers.Ask your health care provider to check your cholesterol and your blood glucose. Continue to have your blood tested as directed by your health care provider.  What should I know about cancer screening? There are several types of cancer. Take the following steps to reduce your risk and to catch any cancer development as early as  possible. Breast Cancer  Practice breast self-awareness. ? This means understanding how your breasts normally appear and feel. ? It also means doing regular breast self-exams. Let your health care provider know about any changes, no matter how small.  If you are 48 or older, have a clinician do a breast exam (clinical breast exam or CBE) every year. Depending on your age, family history, and medical history, it may be recommended that you also have a yearly breast X-ray (mammogram).  If you have a family history of breast cancer, talk with your health care provider about genetic screening.  If you are at high risk for breast cancer, talk with your health care provider about having an MRI and a mammogram every year.  Breast cancer (BRCA) gene test is recommended for women who have family members with BRCA-related cancers. Results of the assessment will determine the need for genetic counseling and BRCA1 and for BRCA2 testing. BRCA-related cancers include these types: ? Breast. This occurs in males or females. ? Ovarian. ? Tubal. This may also be called fallopian tube cancer. ? Cancer of the abdominal or pelvic lining (peritoneal cancer). ? Prostate. ? Pancreatic.  Cervical, Uterine, and Ovarian Cancer Your health care provider may recommend that you be screened regularly for cancer of the pelvic organs. These include your ovaries, uterus, and vagina. This screening involves a pelvic exam, which includes checking for microscopic changes to the surface of your cervix (Pap test).  For women ages 21-65, health care providers may recommend a pelvic exam and a Pap test every three years. For women ages 90-65, they may recommend the Pap test and pelvic exam, combined with testing for human papilloma virus (HPV), every five years. Some types of HPV increase your risk of cervical cancer. Testing for HPV may also be done on women of any age who have unclear Pap test results.  Other health care  providers may not recommend any screening for nonpregnant women who are considered low risk for pelvic cancer and have no symptoms. Ask your health care provider if a screening pelvic exam is right for you.  If you have had past treatment for cervical cancer or a condition that could lead to cancer, you need Pap tests and screening for cancer for at least 20 years after your treatment. If Pap tests have been discontinued for you, your risk factors (such as having a new sexual partner) need to be reassessed to determine if you should start having screenings again. Some women have medical problems that increase the chance of getting cervical cancer. In these cases, your health care provider may recommend that you have screening and Pap tests more often.  If you have a family history of uterine cancer or ovarian cancer, talk with your health care provider about genetic screening.  If you have vaginal bleeding after reaching menopause, tell your health care provider.  There are currently no reliable tests available to screen for ovarian cancer.  Lung Cancer Lung cancer screening is recommended for adults 59-74 years old who are at high risk for lung cancer because of a history of smoking. A yearly low-dose CT scan of the lungs  is recommended if you:  Currently smoke.  Have a history of at least 30 pack-years of smoking and you currently smoke or have quit within the past 15 years. A pack-year is smoking an average of one pack of cigarettes per day for one year.  Yearly screening should:  Continue until it has been 15 years since you quit.  Stop if you develop a health problem that would prevent you from having lung cancer treatment.  Colorectal Cancer  This type of cancer can be detected and can often be prevented.  Routine colorectal cancer screening usually begins at age 32 and continues through age 21.  If you have risk factors for colon cancer, your health care provider may recommend  that you be screened at an earlier age.  If you have a family history of colorectal cancer, talk with your health care provider about genetic screening.  Your health care provider may also recommend using home test kits to check for hidden blood in your stool.  A small camera at the end of a tube can be used to examine your colon directly (sigmoidoscopy or colonoscopy). This is done to check for the earliest forms of colorectal cancer.  Direct examination of the colon should be repeated every 5-10 years until age 38. However, if early forms of precancerous polyps or small growths are found or if you have a family history or genetic risk for colorectal cancer, you may need to be screened more often.  Skin Cancer  Check your skin from head to toe regularly.  Monitor any moles. Be sure to tell your health care provider: ? About any new moles or changes in moles, especially if there is a change in a mole's shape or color. ? If you have a mole that is larger than the size of a pencil eraser.  If any of your family members has a history of skin cancer, especially at a young age, talk with your health care provider about genetic screening.  Always use sunscreen. Apply sunscreen liberally and repeatedly throughout the day.  Whenever you are outside, protect yourself by wearing long sleeves, pants, a wide-brimmed hat, and sunglasses.  What should I know about osteoporosis? Osteoporosis is a condition in which bone destruction happens more quickly than new bone creation. After menopause, you may be at an increased risk for osteoporosis. To help prevent osteoporosis or the bone fractures that can happen because of osteoporosis, the following is recommended:  If you are 19-59 years old, get at least 1,000 mg of calcium and at least 600 mg of vitamin D per day.  If you are older than age 66 but younger than age 80, get at least 1,200 mg of calcium and at least 600 mg of vitamin D per day.  If you  are older than age 49, get at least 1,200 mg of calcium and at least 800 mg of vitamin D per day.  Smoking and excessive alcohol intake increase the risk of osteoporosis. Eat foods that are rich in calcium and vitamin D, and do weight-bearing exercises several times each week as directed by your health care provider. What should I know about how menopause affects my mental health? Depression may occur at any age, but it is more common as you become older. Common symptoms of depression include:  Low or sad mood.  Changes in sleep patterns.  Changes in appetite or eating patterns.  Feeling an overall lack of motivation or enjoyment of activities that you previously enjoyed.  Frequent crying spells.  Talk with your health care provider if you think that you are experiencing depression. What should I know about immunizations? It is important that you get and maintain your immunizations. These include:  Tetanus, diphtheria, and pertussis (Tdap) booster vaccine.  Influenza every year before the flu season begins.  Pneumonia vaccine.  Shingles vaccine.  Your health care provider may also recommend other immunizations. This information is not intended to replace advice given to you by your health care provider. Make sure you discuss any questions you have with your health care provider. Document Released: 12/25/2005 Document Revised: 05/22/2016 Document Reviewed: 08/06/2015 Elsevier Interactive Patient Education  2018 [employer].

## 2017-11-18 ENCOUNTER — Encounter: Payer: Self-pay | Admitting: Internal Medicine

## 2017-11-18 NOTE — Assessment & Plan Note (Signed)
Presumed to be non cardiac given noninvasive testing with coronary calcium CT score of zero.

## 2017-11-18 NOTE — Assessment & Plan Note (Signed)
Incidental finding on cardiac CT.  She has been taking aspirin every other day due to excessive bruising .  Cardiac CT score was zero. Discussed reducing aspirin use toevery 3 days ,  And starting Low dose crestor if her ten year risk of stroke is >8%

## 2017-11-18 NOTE — Assessment & Plan Note (Signed)
Continue Evista , started in 2007

## 2017-11-18 NOTE — Assessment & Plan Note (Signed)
Annual comprehensive preventive exam was done as well as an evaluation and management of chronic conditions .  During the course of the visit the patient was educated and counseled about appropriate screening and preventive services including :  diabetes screening, lipid analysis with projected  10 year  risk for CAD , nutrition counseling, breast, cervical and colorectal cancer screening, and recommended immunizations.  Printed recommendations for health maintenance screenings was given 

## 2017-11-29 ENCOUNTER — Other Ambulatory Visit: Payer: 59

## 2017-11-30 ENCOUNTER — Telehealth: Payer: Self-pay | Admitting: Radiology

## 2017-11-30 ENCOUNTER — Other Ambulatory Visit (INDEPENDENT_AMBULATORY_CARE_PROVIDER_SITE_OTHER): Payer: 59

## 2017-11-30 DIAGNOSIS — R5383 Other fatigue: Secondary | ICD-10-CM | POA: Diagnosis not present

## 2017-11-30 DIAGNOSIS — Z13220 Encounter for screening for lipoid disorders: Secondary | ICD-10-CM | POA: Diagnosis not present

## 2017-11-30 LAB — CBC WITH DIFFERENTIAL/PLATELET
BASOS ABS: 0.1 10*3/uL (ref 0.0–0.1)
Basophils Relative: 1.7 % (ref 0.0–3.0)
EOS ABS: 0.6 10*3/uL (ref 0.0–0.7)
Eosinophils Relative: 11.2 % — ABNORMAL HIGH (ref 0.0–5.0)
HEMATOCRIT: 41.7 % (ref 36.0–46.0)
Hemoglobin: 14 g/dL (ref 12.0–15.0)
LYMPHS PCT: 32.8 % (ref 12.0–46.0)
Lymphs Abs: 1.6 10*3/uL (ref 0.7–4.0)
MCHC: 33.6 g/dL (ref 30.0–36.0)
MCV: 93.8 fl (ref 78.0–100.0)
MONO ABS: 0.3 10*3/uL (ref 0.1–1.0)
Monocytes Relative: 6.5 % (ref 3.0–12.0)
NEUTROS PCT: 47.8 % (ref 43.0–77.0)
Neutro Abs: 2.4 10*3/uL (ref 1.4–7.7)
PLATELETS: 198 10*3/uL (ref 150.0–400.0)
RBC: 4.45 Mil/uL (ref 3.87–5.11)
RDW: 12.3 % (ref 11.5–15.5)
WBC: 5 10*3/uL (ref 4.0–10.5)

## 2017-11-30 LAB — COMPREHENSIVE METABOLIC PANEL
ALBUMIN: 4 g/dL (ref 3.5–5.2)
ALT: 14 U/L (ref 0–35)
AST: 21 U/L (ref 0–37)
Alkaline Phosphatase: 42 U/L (ref 39–117)
BUN: 17 mg/dL (ref 6–23)
CHLORIDE: 101 meq/L (ref 96–112)
CO2: 31 meq/L (ref 19–32)
CREATININE: 0.8 mg/dL (ref 0.40–1.20)
Calcium: 9.1 mg/dL (ref 8.4–10.5)
GFR: 76.73 mL/min (ref >60.00)
GLUCOSE: 87 mg/dL (ref 70–99)
POTASSIUM: 3.8 meq/L (ref 3.5–5.1)
SODIUM: 138 meq/L (ref 135–145)
Total Bilirubin: 0.6 mg/dL (ref 0.2–1.2)
Total Protein: 6.9 g/dL (ref 6.0–8.3)

## 2017-11-30 LAB — TSH: TSH: 1.28 u[IU]/mL (ref 0.35–4.50)

## 2017-11-30 LAB — LIPID PANEL
CHOL/HDL RATIO: 3
Cholesterol: 185 mg/dL (ref 0–200)
HDL: 57.2 mg/dL (ref >39.00)
LDL CALC: 110 mg/dL — AB (ref 0–99)
NONHDL: 127.92
Triglycerides: 89 mg/dL (ref 0.0–149.0)
VLDL: 17.8 mg/dL (ref 0.0–40.0)

## 2017-11-30 NOTE — Telephone Encounter (Signed)
Pt was outside of office at 7:25am wanting to know what time office opened. Advised pt office did not open until 8am. Ensured pt was ok and asked if it was an emergency. Pt stated she had jury duty and needed her labs drawn now. Advised pt again that office did not open until 8am and would draw her labs as soon as the office opened. PT was brought down to lab. Pt had unlabeled urine specimen and stated "my husband is a physician and said I needed this." Advised pt that PCP did not order urine tests. Pt stated she was having no urinary symptoms. Pt was drawn on one successful stick.

## 2017-12-01 ENCOUNTER — Encounter: Payer: Self-pay | Admitting: Internal Medicine

## 2017-12-27 DIAGNOSIS — H5203 Hypermetropia, bilateral: Secondary | ICD-10-CM | POA: Diagnosis not present

## 2017-12-27 DIAGNOSIS — H25013 Cortical age-related cataract, bilateral: Secondary | ICD-10-CM | POA: Diagnosis not present

## 2018-01-31 ENCOUNTER — Other Ambulatory Visit: Payer: Self-pay | Admitting: Internal Medicine

## 2018-02-28 ENCOUNTER — Ambulatory Visit
Admission: RE | Admit: 2018-02-28 | Discharge: 2018-02-28 | Disposition: A | Discharge status: Home / Self Care | Payer: 59 | Source: Ambulatory Visit | Attending: Internal Medicine | Admitting: Internal Medicine

## 2018-02-28 DIAGNOSIS — Z1231 Encounter for screening mammogram for malignant neoplasm of breast: Secondary | ICD-10-CM | POA: Insufficient documentation

## 2018-02-28 DIAGNOSIS — Z1239 Encounter for other screening for malignant neoplasm of breast: Secondary | ICD-10-CM

## 2018-02-28 HISTORY — DX: Personal history of irradiation: Z92.3

## 2018-02-28 HISTORY — DX: Personal history of antineoplastic chemotherapy: Z92.21

## 2018-08-15 ENCOUNTER — Other Ambulatory Visit: Payer: Self-pay | Admitting: Internal Medicine

## 2018-11-21 ENCOUNTER — Encounter: Payer: Self-pay | Admitting: Internal Medicine

## 2018-11-21 ENCOUNTER — Ambulatory Visit (INDEPENDENT_AMBULATORY_CARE_PROVIDER_SITE_OTHER): Payer: 59 | Admitting: Internal Medicine

## 2018-11-21 VITALS — BP 110/70 | HR 86 | Temp 98.5°F | Resp 14 | Ht 64.0 in | Wt 123.8 lb

## 2018-11-21 DIAGNOSIS — Z124 Encounter for screening for malignant neoplasm of cervix: Secondary | ICD-10-CM | POA: Diagnosis not present

## 2018-11-21 DIAGNOSIS — Z853 Personal history of malignant neoplasm of breast: Secondary | ICD-10-CM | POA: Diagnosis not present

## 2018-11-21 DIAGNOSIS — R002 Palpitations: Secondary | ICD-10-CM | POA: Diagnosis not present

## 2018-11-21 DIAGNOSIS — Z0000 Encounter for general adult medical examination without abnormal findings: Secondary | ICD-10-CM | POA: Diagnosis not present

## 2018-11-21 DIAGNOSIS — I700 Atherosclerosis of aorta: Secondary | ICD-10-CM

## 2018-11-21 DIAGNOSIS — Z85828 Personal history of other malignant neoplasm of skin: Secondary | ICD-10-CM | POA: Diagnosis not present

## 2018-11-21 MED ORDER — RALOXIFENE HCL 60 MG PO TABS: 60.0000 mg | ORAL_TABLET | Freq: Every day | ORAL | 1 refills | Status: DC

## 2018-11-21 MED ORDER — ESTRADIOL 0.1 MG/GM VA CREA: TOPICAL_CREAM | VAGINAL | 4 refills | Status: DC

## 2018-11-21 MED ORDER — ZOSTER VAC RECOMB ADJUVANTED 50 MCG/0.5ML IM SUSR: 0.5000 mL | Freq: Once | INTRAMUSCULAR | 1 refills | Status: AC

## 2018-11-21 NOTE — Patient Instructions (Addendum)
MRI Breast ordered Dermatology referral made GYN referral to Dr Servando Salina made  We'll repeat DEXA next year  The ShingRx vaccine is now available in local pharmacies and is much more protective than the old one  Zostavax  (it is about 97%  Effective in preventing shingles). .   It is therefore ADVISED for all interested adults over 50 to prevent shingles so I have printed you a prescription for it.  (it requires a 2nd dose 2 too 6 months after the first one) .  It will cause you to have flu  like symptoms for 2 days     For your constipation:  The first course is to increase the fiber in your diet to 25 g daily . Fruit and vegetables are good sources,  The [employer] and Atkins protein bars , and the low carb breads   (see below)  are all heavy on  fiber, especially the Mission Carb Balance Tortilla (whole wheat  25 g fiber!)    You can also take miralax, metamucil, fibercon, or citrucel daily to supplement your fiber. These are gentle and work in 1 to 2 days to relieve constipation, and  you can also combine them daily with colace,  A stool softener .  Also,  make 4 16 ounce servings of water your minimum goal for water intake

## 2018-11-21 NOTE — Progress Notes (Signed)
Patient ID: Mackenzie Dean, female    DOB: [birth year]  Age: 68 y.o. MRN: [id number]  The patient is here for annual preventive  examination and management of other chronic and acute problems. Last seen one year ago.   Due for PAP smear but wants GYN to do it . Dr. Ouida Sills  Examined her March 2018 ultrasoudn noted endometrial  Thickening. Biopsy normal . Wants to see [name]  MRI breast needed. DEXA 2016  Maryellen Pile  Due now   Colonoscopy nov 2018. Hemorrhoid occasional flare     History of palpitations,  Saw cardiology.  Offered a calcium score  Which was zero but had mild placque in aorta.  Statin intolerance while on aromatase inhibitors     The risk factors are reflected in the social history.  The roster of all physicians providing medical care to patient - is listed in the Snapshot section of the chart.  Activities of daily living:  The patient is 100% independent in all ADLs: dressing, toileting, feeding as well as independent mobility  Home safety : The patient has smoke detectors in the home. They wear seatbelts.  There are no firearms at home. There is no violence in the home.   There is no risks for hepatitis, STDs or HIV. There is no   history of blood transfusion. They have no travel history to infectious disease endemic areas of the world.  The patient has seen their dentist in the last six month. They have seen their eye doctor in the last year. They admit to slight hearing difficulty with regard to whispered voices and some television programs.  They have deferred audiologic testing in the last year.  They do not  have excessive sun exposure. Discussed the need for sun protection: hats, long sleeves and use of sunscreen if there is significant sun exposure.   Diet: the importance of a healthy diet is discussed. They do have a healthy diet.  The benefits of regular aerobic exercise were discussed. She walks 4 times per week ,  60 minutes.   Depression  screen: there are no signs or vegative symptoms of depression- irritability, change in appetite, anhedonia, sadness/tearfullness.  Cognitive assessment: the patient manages all their financial and personal affairs and is actively engaged. They could relate day,date,year and events; recalled 2/3 objects at 3 minutes; performed clock-face test normally.  The following portions of the patient's history were reviewed and updated as appropriate: allergies, current medications, past family history, past medical history,  past surgical history, past social history  and problem list.  Visual acuity was not assessed per patient preference since she has regular follow up with her ophthalmologist. Hearing and body mass index were assessed and reviewed.   During the course of the visit the patient was educated and counseled about appropriate screening and preventive services including : fall prevention , diabetes screening, nutrition counseling, colorectal cancer screening, and recommended immunizations.    CC: The primary encounter diagnosis was Thoracic aortic atherosclerosis ([place]). Diagnoses of Palpitations, Cervical cancer screening, History of breast cancer, History of skin cancer, History of breast cancer in female, and Visit for preventive health examination were also pertinent to this visit.  1) she is requesting a referral to Dr Garwin Brothers for pelvic evaluation .  2) constipation:  Intermittent,  Aggravates hemorrhoids   History [name] has a past medical history of Allergy, Breast cancer ([place]) (2001), Cancer ([place]) (2001), Chicken pox, Heart murmur, Hyperlipidemia, Personal history of chemotherapy (2001), and Personal history  of radiation therapy (2001).   She has a past surgical history that includes Breast surgery (Left, 2002); Dilation and curettage of uterus; Breast biopsy (Right, 2001); Breast biopsy (Left, 2001); and Breast lumpectomy (Left, 2001).   Her family history includes Arrhythmia in her  mother; Breast cancer in her cousin and cousin; Dementia in her mother; Heart disease in her father and mother; Heart failure in her paternal aunt; Mental retardation in her mother; Stroke in her father.She reports that she is a non-smoker but has been exposed to tobacco smoke. She has never used smokeless tobacco. She reports current alcohol use of about 1.0 standard drinks of alcohol per week. She reports that she does not use drugs.  Outpatient Medications Prior to Visit  Medication Sig Dispense Refill  . aspirin 81 MG tablet Take 81 mg by mouth daily.     . calcium-vitamin D (OSCAL WITH D) 500-200 MG-UNIT per tablet Take 1 tablet by mouth daily with breakfast.    . cetirizine (ZYRTEC) 10 MG tablet Take 10 mg by mouth daily.    . magnesium 30 MG tablet Take 30 mg by mouth 2 (two) times daily.    . Multiple Vitamins-Minerals (MULTIVITAMIN PO) Take 1 capsule by mouth daily.    . Omega-3 Fatty Acids (FISH OIL) 1200 MG CAPS Take 1 capsule by mouth 2 (two) times daily.    Marland Kitchen ESTRACE VAGINAL 0.1 MG/GM vaginal cream PLACE 1 APPLICATORFUL VAGINALLY 2 TIMES A WEEK. 42.5 g 4  . raloxifene (EVISTA) 60 MG tablet TAKE 1 TABLET BY MOUTH DAILY. 90 tablet 1  . clotrimazole-betamethasone (LOTRISONE) lotion Apply 1 application topically 2 (two) times daily.     No facility-administered medications prior to visit.     Review of Systems   Patient denies headache, fevers, malaise, unintentional weight loss, skin rash, eye pain, sinus congestion and sinus pain, sore throat, dysphagia,  hemoptysis , cough, dyspnea, wheezing, chest pain, palpitations, orthopnea, edema, abdominal pain, nausea, melena, diarrhea, constipation, flank pain, dysuria, hematuria, urinary  Frequency, nocturia, numbness, tingling, seizures,  Focal weakness, Loss of consciousness,  Tremor, insomnia, depression, anxiety, and suicidal ideation.      Objective:  BP 110/70 (BP Location: Left Arm, Patient Position: Sitting, Cuff Size: Normal)    Pulse 86   Temp 98.5 F (36.9 C) (Oral)   Resp 14   Ht 5\' 4"  (1.626 m)   Wt 123 lb 12.8 oz (56.2 kg)   SpO2 98%   BMI 21.25 kg/m   Physical Exam  General appearance: alert, cooperative and appears stated age Head: Normocephalic, without obvious abnormality, atraumatic Eyes: conjunctivae/corneas clear. PERRL, EOM's intact. Fundi benign. Ears: normal TM's and external ear canals both ears Nose: Nares normal. Septum midline. Mucosa normal. No drainage or sinus tenderness. Throat: lips, mucosa, and tongue normal; teeth and gums normal Neck: no adenopathy, no carotid bruit, no JVD, supple, symmetrical, trachea midline and thyroid not enlarged, symmetric, no tenderness/mass/nodules Lungs: clear to auscultation bilaterally Breasts: left breast with surgical scar at LLQ , otherwise normal appearance, no masses or tenderness Heart: regular rate and rhythm, S1, S2 normal, no murmur, click, rub or gallop Abdomen: soft, non-tender; bowel sounds normal; no masses,  no organomegaly Extremities: extremities normal, atraumatic, no cyanosis or edema Pulses: 2+ and symmetric Skin: Skin color, texture, turgor normal. No rashes or lesions Neurologic: Alert and oriented X 3, normal strength and tone. Normal symmetric reflexes. Normal coordination and gait.     Assessment & Plan:   Problem List Items Addressed  This Visit    History of breast cancer in female    MRI breast  Alternating with mammogram have been ordered.       Palpitations   Relevant Orders   Comprehensive metabolic panel   TSH   CBC with Differential/Platelet   Thoracic aortic atherosclerosis ([place]) - Primary    Noted on CT chest / coronary calcium score was zero .Marland Kitchen  She remains contemplative of statin therapy due to family history   Lab Results  Component Value Date   CHOL 185 11/30/2017   HDL 57.20 11/30/2017   LDLCALC 110 (H) 11/30/2017   TRIG 89.0 11/30/2017   CHOLHDL 3 11/30/2017         Relevant Orders   Lipid  panel   Visit for preventive health examination    age appropriate education and counseling updated, referrals for preventative services and immunizations addressed, dietary and smoking counseling addressed, most recent labs reviewed.  I have personally reviewed and have noted:  1) the patient's medical and social history 2) The pt's use of alcohol, tobacco, and illicit drugs 3) The patient's current medications and supplements 4) Functional ability including ADL's, fall risk, home safety risk, hearing and visual impairment 5) Diet and physical activities 6) Evidence for depression or mood disorder 7) The patient's height, weight, and BMI have been recorded in the chart  I have made referrals, and provided counseling and education based on review of the above  A total of 25 additional minutes was spent with patient addressing her acute and chronic problems.         Other Visit Diagnoses    Cervical cancer screening       Relevant Orders   Ambulatory referral to Gynecology   History of breast cancer       Relevant Orders   MR BREAST BILATERAL W CONTRAST   History of skin cancer       Relevant Orders   Ambulatory referral to Dermatology      I have discontinued Lona Kettle. Haymaker's clotrimazole-betamethasone. I have changed her ESTRACE VAGINAL to estradiol. I have also changed her raloxifene. Additionally, I am having her start on Zoster Vaccine Adjuvanted. Lastly, I am having her maintain her cetirizine, Fish Oil, calcium-vitamin D, aspirin, Multiple Vitamins-Minerals (MULTIVITAMIN PO), and magnesium.  Meds ordered this encounter  Medications  . Zoster Vaccine Adjuvanted [facility]) injection    Sig: Inject 0.5 mLs into the muscle once for 1 dose.    Dispense:  1 each    Refill:  1  . estradiol (ESTRACE VAGINAL) 0.1 MG/GM vaginal cream    Sig: PLACE 1 APPLICATORFUL VAGINALLY 2 TIMES A WEEK.    Dispense:  42.5 g    Refill:  4  . raloxifene (EVISTA) 60 MG tablet    Sig: Take 1  tablet (60 mg total) by mouth daily.    Dispense:  90 tablet    Refill:  1    Medications Discontinued During This Encounter  Medication Reason  . clotrimazole-betamethasone (LOTRISONE) lotion Patient has not taken in last 30 days  . ESTRACE VAGINAL 0.1 MG/GM vaginal cream Reorder  . raloxifene (EVISTA) 60 MG tablet Reorder    Follow-up: No follow-ups on file.   Crecencio Mc, MD

## 2018-11-22 ENCOUNTER — Other Ambulatory Visit: Payer: 59

## 2018-11-22 NOTE — Assessment & Plan Note (Signed)
Noted on CT chest / coronary calcium score was zero .Marland Kitchen  She remains contemplative of statin therapy due to family history   Lab Results  Component Value Date   CHOL 185 11/30/2017   HDL 57.20 11/30/2017   LDLCALC 110 (H) 11/30/2017   TRIG 89.0 11/30/2017   CHOLHDL 3 11/30/2017

## 2018-11-22 NOTE — Assessment & Plan Note (Signed)
MRI breast  Alternating with mammogram have been ordered.

## 2018-11-22 NOTE — Assessment & Plan Note (Addendum)
age appropriate education and counseling updated, referrals for preventative services and immunizations addressed, dietary and smoking counseling addressed, most recent labs reviewed.  I have personally reviewed and have noted:  1) the patient's medical and social history 2) The pt's use of alcohol, tobacco, and illicit drugs 3) The patient's current medications and supplements 4) Functional ability including ADL's, fall risk, home safety risk, hearing and visual impairment 5) Diet and physical activities 6) Evidence for depression or mood disorder 7) The patient's height, weight, and BMI have been recorded in the chart  I have made referrals, and provided counseling and education based on review of the above  A total of 25 additional minutes was spent with patient addressing her acute and chronic problems.

## 2018-11-23 ENCOUNTER — Other Ambulatory Visit (INDEPENDENT_AMBULATORY_CARE_PROVIDER_SITE_OTHER): Payer: 59

## 2018-11-23 ENCOUNTER — Other Ambulatory Visit: Payer: 59

## 2018-11-23 DIAGNOSIS — I700 Atherosclerosis of aorta: Secondary | ICD-10-CM | POA: Diagnosis not present

## 2018-11-23 DIAGNOSIS — R002 Palpitations: Secondary | ICD-10-CM | POA: Diagnosis not present

## 2018-11-23 LAB — CBC WITH DIFFERENTIAL/PLATELET
BASOS PCT: 3 % (ref 0.0–3.0)
Basophils Absolute: 0.1 10*3/uL (ref 0.0–0.1)
Eosinophils Absolute: 0.5 10*3/uL (ref 0.0–0.7)
Eosinophils Relative: 10.4 % — ABNORMAL HIGH (ref 0.0–5.0)
HCT: 42.3 % (ref 36.0–46.0)
HEMOGLOBIN: 14.6 g/dL (ref 12.0–15.0)
Lymphocytes Relative: 34 % (ref 12.0–46.0)
Lymphs Abs: 1.6 10*3/uL (ref 0.7–4.0)
MCHC: 34.6 g/dL (ref 30.0–36.0)
MCV: 92.9 fl (ref 78.0–100.0)
MONO ABS: 0.3 10*3/uL (ref 0.1–1.0)
Monocytes Relative: 6.9 % (ref 3.0–12.0)
Neutro Abs: 2.1 10*3/uL (ref 1.4–7.7)
Neutrophils Relative %: 45.7 % (ref 43.0–77.0)
Platelets: 202 10*3/uL (ref 150.0–400.0)
RBC: 4.56 Mil/uL (ref 3.87–5.11)
RDW: 12.7 % (ref 11.5–15.5)
WBC: 4.7 10*3/uL (ref 4.0–10.5)

## 2018-11-23 LAB — COMPREHENSIVE METABOLIC PANEL
ALK PHOS: 39 U/L (ref 39–117)
ALT: 17 U/L (ref 0–35)
AST: 23 U/L (ref 0–37)
Albumin: 4.2 g/dL (ref 3.5–5.2)
BUN: 15 mg/dL (ref 6–23)
CO2: 31 mEq/L (ref 19–32)
CREATININE: 0.84 mg/dL (ref 0.40–1.20)
Calcium: 9.5 mg/dL (ref 8.4–10.5)
Chloride: 102 mEq/L (ref 96–112)
GFR: 72.31 mL/min (ref >60.00)
GLUCOSE: 77 mg/dL (ref 70–99)
POTASSIUM: 3.9 meq/L (ref 3.5–5.1)
SODIUM: 140 meq/L (ref 135–145)
TOTAL PROTEIN: 7.1 g/dL (ref 6.0–8.3)
Total Bilirubin: 0.8 mg/dL (ref 0.2–1.2)

## 2018-11-23 LAB — LIPID PANEL
CHOLESTEROL: 200 mg/dL (ref 0–200)
HDL: 61.5 mg/dL (ref >39.00)
LDL Cholesterol: 124 mg/dL — ABNORMAL HIGH (ref 0–99)
NonHDL: 138.1
TRIGLYCERIDES: 73 mg/dL (ref 0.0–149.0)
Total CHOL/HDL Ratio: 3
VLDL: 14.6 mg/dL (ref 0.0–40.0)

## 2018-11-23 LAB — TSH: TSH: 1.83 u[IU]/mL (ref 0.35–4.50)

## 2018-12-15 ENCOUNTER — Other Ambulatory Visit: Payer: Self-pay | Admitting: Obstetrics and Gynecology

## 2018-12-19 DIAGNOSIS — R140 Abdominal distension (gaseous): Secondary | ICD-10-CM | POA: Diagnosis not present

## 2018-12-19 DIAGNOSIS — Z01419 Encounter for gynecological examination (general) (routine) without abnormal findings: Secondary | ICD-10-CM | POA: Diagnosis not present

## 2018-12-19 DIAGNOSIS — Z853 Personal history of malignant neoplasm of breast: Secondary | ICD-10-CM | POA: Diagnosis not present

## 2018-12-19 DIAGNOSIS — N952 Postmenopausal atrophic vaginitis: Secondary | ICD-10-CM | POA: Diagnosis not present

## 2018-12-19 LAB — HM PAP SMEAR: HM Pap smear: NORMAL

## 2018-12-23 ENCOUNTER — Telehealth: Payer: Self-pay

## 2018-12-23 DIAGNOSIS — Z853 Personal history of malignant neoplasm of breast: Secondary | ICD-10-CM

## 2018-12-23 NOTE — Telephone Encounter (Signed)
Reordered breast Mri for with and without contrast

## 2018-12-29 DIAGNOSIS — D1800 Hemangioma unspecified site: Secondary | ICD-10-CM | POA: Diagnosis not present

## 2018-12-29 DIAGNOSIS — L812 Freckles: Secondary | ICD-10-CM | POA: Diagnosis not present

## 2018-12-29 DIAGNOSIS — Z85828 Personal history of other malignant neoplasm of skin: Secondary | ICD-10-CM | POA: Diagnosis not present

## 2018-12-29 DIAGNOSIS — L821 Other seborrheic keratosis: Secondary | ICD-10-CM | POA: Diagnosis not present

## 2018-12-29 DIAGNOSIS — D229 Melanocytic nevi, unspecified: Secondary | ICD-10-CM | POA: Diagnosis not present

## 2018-12-29 DIAGNOSIS — L578 Other skin changes due to chronic exposure to nonionizing radiation: Secondary | ICD-10-CM | POA: Diagnosis not present

## 2018-12-29 DIAGNOSIS — Z1283 Encounter for screening for malignant neoplasm of skin: Secondary | ICD-10-CM | POA: Diagnosis not present

## 2018-12-29 DIAGNOSIS — L719 Rosacea, unspecified: Secondary | ICD-10-CM | POA: Diagnosis not present

## 2018-12-29 DIAGNOSIS — L99 Other disorders of skin and subcutaneous tissue in diseases classified elsewhere: Secondary | ICD-10-CM | POA: Diagnosis not present

## 2019-01-17 DIAGNOSIS — R140 Abdominal distension (gaseous): Secondary | ICD-10-CM | POA: Diagnosis not present

## 2019-01-17 DIAGNOSIS — Z853 Personal history of malignant neoplasm of breast: Secondary | ICD-10-CM | POA: Diagnosis not present

## 2019-01-19 DIAGNOSIS — Z853 Personal history of malignant neoplasm of breast: Secondary | ICD-10-CM | POA: Diagnosis not present

## 2019-01-19 DIAGNOSIS — Z1239 Encounter for other screening for malignant neoplasm of breast: Secondary | ICD-10-CM | POA: Diagnosis not present

## 2019-01-31 NOTE — Progress Notes (Signed)
Cardiology Office Note  Date:  02/01/2019   ID:  Mackenzie Dean, DOB: 10-Mar-1951, MRN: [id number]  PCP:  Crecencio Mc, MD   Chief Complaint  Patient presents with  . New Patient (Initial Visit)    Referred by Dr. Jeananne Rama. Patient c/o chest tightness, upper arm pain, and palpitations. Meds reviewed verbally with patient.     HPI:  Mackenzie Dean is a 68 y.o.  with a PMHx of: Thoracic aortic atherosclerosis  Osteopenia Palpitations Chest pain Breat cancer s/p radiation  HLD She presents from as a referral from Dr. Rance Dean office for an evaluation of interim chest pain.  INTERVAL HISTORY: The patient reports today with her husband, Dr. Jeananne Rama for an initial visit. She states that she was in [place] last week until 01/30/2019 for vacation, when she developed palpitations described as a acute onset of tachycardia and mild chest tightness on 01/28/2019. She checked her apple watch, which showed a HR of 138 bpm and no signs of Afib. She took deep breaths with relief of symptoms and converted back to a slower rate after several minutes She did have some arm tightness in the setting of the tachycardia Rarely will have periodic bilateral arm tightness.   She has an extensive family history of Afib and HLD (mom, aunt and uncle).   She denies any other complaints at this time. She adds that she walks outside for cardio; she states that her HR goes up to 150-160 bpm while working with a trainer on a treadmill. She adds that 5 years ago on her calcium score, she had plaque buildup in her aorta, but her PCP advised against statin use. She denies a history of smoking.   CT coronary calcium scoring images reviewed with her showing 1 region punctate/mild aortic atherosclerosis in the proximal descending aorta No significant coronary calcification noted  Today's Blood pressure 110/72 Total Chol 200/ LDL 124 CR 0.84 Glucose 77   EKG personally reviewed by myself on today's visit Shows  NSR rhythm. 86 bpm. Possible left atrial enlargement  OTHER PAST MEDICAL HISTORY REVIEWED BY ME FOR TODAY'S VISIT: Prior history of breast cancer on the left received rounds of radiation approximately 2010  Last stress test was 5 years ago.   PMH:   has a past medical history of Allergy, Breast cancer ([place]) (2001), Cancer ([place]) (2001), Chicken pox, Heart murmur, Hyperlipidemia, Personal history of chemotherapy (2001), and Personal history of radiation therapy (2001).  PSH:    Past Surgical History:  Procedure Laterality Date  . BREAST BIOPSY Right 2001   negative  . BREAST BIOPSY Left 2001   breast cancer invasive stage 1 ER +  . BREAST LUMPECTOMY Left 2001   invasive mammary carcinoma  . BREAST SURGERY Left 2002  . DILATION AND CURETTAGE OF UTERUS      Current Outpatient Medications  Medication Sig Dispense Refill  . aspirin 81 MG tablet Take 81 mg by mouth daily.     . calcium-vitamin D (OSCAL WITH D) 500-200 MG-UNIT per tablet Take 1 tablet by mouth daily with breakfast.    . cetirizine (ZYRTEC) 10 MG tablet Take 10 mg by mouth daily.    Marland Kitchen estradiol (ESTRACE VAGINAL) 0.1 MG/GM vaginal cream PLACE 1 APPLICATORFUL VAGINALLY 2 TIMES A WEEK. 42.5 g 4  . magnesium 30 MG tablet Take 30 mg by mouth 2 (two) times daily.    . Multiple Vitamins-Minerals (MULTIVITAMIN PO) Take 1 capsule by mouth daily.    . Omega-3 Fatty Acids (FISH  OIL) 1200 MG CAPS Take 1 capsule by mouth 2 (two) times daily.    . raloxifene (EVISTA) 60 MG tablet Take 1 tablet (60 mg total) by mouth daily. 90 tablet 1  . propranolol (INDERAL) 20 MG tablet Take 1 tablet (20 mg total) by mouth 3 (three) times daily as needed. 90 tablet 3  . rosuvastatin (CRESTOR) 5 MG tablet Take 1 tablet (5 mg total) by mouth daily. 90 tablet 3   No current facility-administered medications for this visit.     ALLERGIES:   Augmentin [amoxicillin-pot clavulanate]   SOCIAL HISTORY:  The patient  reports that she is a non-smoker but has  been exposed to tobacco smoke. She has never used smokeless tobacco. She reports current alcohol use of about 1.0 standard drinks of alcohol per week. She reports that she does not use drugs.   FAMILY HISTORY:   family history includes Arrhythmia in her mother; Breast cancer in her cousin and cousin; Dementia in her mother; Heart disease in her father and mother; Heart failure in her paternal aunt; Mental retardation in her mother; Stroke in her father.    REVIEW OF SYSTEMS: Review of Systems  Constitutional: Negative.   Eyes: Negative.   Respiratory: Negative.   Cardiovascular: Positive for chest pain (tightness) and palpitations.       Tachycardia Arm squeezing pain  Gastrointestinal: Negative.   Genitourinary: Negative.   Musculoskeletal: Negative.        Right arm tightness  Neurological: Negative.   Psychiatric/Behavioral: Negative.   All other systems reviewed and are negative.    PHYSICAL EXAM: VS:  BP 110/72 (BP Location: Right Arm, Patient Position: Sitting, Cuff Size: Normal)   Pulse 86   Ht 5\' 4"  (1.626 m)   Wt 121 lb 8 oz (55.1 kg)   BMI 20.86 kg/m  , BMI Body mass index is 20.86 kg/m.  GEN: Well nourished, well developed, in no acute distress HEENT: normal Neck: no JVD, carotid bruits, or masses Cardiac: RRR; no murmurs, rubs, or gallops,no edema  Respiratory:  clear to auscultation bilaterally, normal work of breathing GI: soft, nontender, nondistended, + BS MS: no deformity or atrophy Skin: warm and dry, no rash Neuro:  Strength and sensation are intact Psych: euthymic mood, full affect   RECENT LABS: 11/23/2018: ALT 17; BUN 15; Creatinine, Ser 0.84; Hemoglobin 14.6; Platelets 202.0; Potassium 3.9; Sodium 140; TSH 1.83    LIPID PANEL: Lab Results  Component Value Date   CHOL 200 11/23/2018   HDL 61.50 11/23/2018   LDLCALC 124 (H) 11/23/2018   TRIG 73.0 11/23/2018      WEIGHT: Wt Readings from Last 3 Encounters:  02/01/19 121 lb 8 oz (55.1 kg)   11/21/18 123 lb 12.8 oz (56.2 kg)  11/17/17 120 lb 12.8 oz (54.8 kg)       ASSESSMENT AND PLAN:  Paroxysmal tachycardia ([place]) - Plan: EKG 12-Lead, suspect secondary to short run of atrial tachycardia Rate was in the 130 range.  No atrial fibrillation detected by her watch Presented acutely and resolved after several minutes, slow deep breathing.  Edema after stressors, getting to [place] at [place] discussion concerning various treatment options including watchful waiting at this time versus wearing a monitor for recurrent symptoms. We have given a prescription for propranolol 20 mg to take as needed for prolonged episodes  Thoracic aortic atherosclerosis ([facility]) Mild aortic calcification noted on CT scan, images reviewed with her in detail.  She does have history of radiation left chest  which can accelerate coronary disease.  After long discussion she prefers to treat this aggressively.  We will start Crestor 5 mg daily  Chest pain, unspecified type -  Somewhat atypical though one episode to present in the setting of atrial tachycardia rates in the 130s.  Given risk factor of radiation left chest, recommended treadmill stress echo at her convenience She does have some discomfort in her arms at rest which is likely atypical in nature  Mixed hyperlipidemia We have prescribed Crestor 5 mg daily She does have history of radiation, elevated LDL in the 120s Would recommend we treat her prior left chest radiation as a risk factor consistent with diabetes or worse.  Goal LDL less than 100, preferably 70  Disposition: F/U as needed  Long discussion concerning various types of arrhythmia and treatment Discussed risk factors for coronary disease, aortic atherosclerosis, treatment of cholesterol Total encounter time more than 60 minutes. Greater than 50% was spent in counseling and coordination of care with the patient.    Orders Placed This Encounter  Procedures  . EKG 12-Lead  .  ECHOCARDIOGRAM STRESS TEST     I, Raynelle Bring acting as a scribe for Ida Rogue, M.D., Ph.D.  I, Ida Rogue, M.D. Ph.D., have reviewed the above documentation for accuracy and completeness, and I agree with the above.   Signed, Esmond Plants, M.D., Ph.D. 02/01/2019  [place], [place]

## 2019-02-01 ENCOUNTER — Encounter: Payer: Self-pay | Admitting: Cardiovascular Disease

## 2019-02-01 ENCOUNTER — Ambulatory Visit (INDEPENDENT_AMBULATORY_CARE_PROVIDER_SITE_OTHER): Payer: 59 | Admitting: Cardiovascular Disease

## 2019-02-01 ENCOUNTER — Other Ambulatory Visit: Payer: Self-pay

## 2019-02-01 VITALS — BP 110/72 | HR 86 | Ht 64.0 in | Wt 121.5 lb

## 2019-02-01 DIAGNOSIS — I479 Paroxysmal tachycardia, unspecified: Secondary | ICD-10-CM

## 2019-02-01 DIAGNOSIS — I700 Atherosclerosis of aorta: Secondary | ICD-10-CM

## 2019-02-01 DIAGNOSIS — E782 Mixed hyperlipidemia: Secondary | ICD-10-CM

## 2019-02-01 DIAGNOSIS — R079 Chest pain, unspecified: Secondary | ICD-10-CM

## 2019-02-01 MED ORDER — ROSUVASTATIN CALCIUM 5 MG PO TABS: 5.0000 mg | ORAL_TABLET | Freq: Every day | ORAL | 3 refills | Status: DC

## 2019-02-01 MED ORDER — PROPRANOLOL HCL 20 MG PO TABS: 20.0000 mg | ORAL_TABLET | Freq: Three times a day (TID) | ORAL | 3 refills | Status: DC | PRN

## 2019-02-01 NOTE — Patient Instructions (Addendum)
Monitor heart rate If needed, call for a ZIO monitor    Medication Instructions:  Your physician has recommended you make the following change in your medication:  1. TAKE Propranolol 10 to 20 mg as needed for tachycardia or fast heart rates 2. START Crestor 5 mg once daily  If you need a refill on your cardiac medications before your next appointment, please call your pharmacy.    Lab work: No new labs needed   If you have labs (blood work) drawn today and your tests are completely normal, you will receive your results only by: Marland Kitchen MyChart Message (if you have MyChart) OR . A paper copy in the mail If you have any lab test that is abnormal or we need to change your treatment, we will call you to review the results.   Testing/Procedures: We will schedule a treadmill stress echo for chest pain Your physician has requested that you have a stress echocardiogram. For further information please visit [email or web address]. Please follow instruction sheet as given.   Do not drink or eat foods with caffeine for 24 hours before the test. (Chocolate, coffee, tea, or energy drinks)  If you use an inhaler, bring it with you to the test.  Do not smoke for 4 hours before the test.  Wear comfortable shoes and clothing.  Follow-Up: At [facility], you and your health needs are our priority.  As part of our continuing mission to provide you with exceptional heart care, we have created designated Provider Care Teams.  These Care Teams include your primary Cardiologist (physician) and Advanced Practice Providers (APPs -  Physician Assistants and Nurse Practitioners) who all work together to provide you with the care you need, when you need it.  . You will need a follow up appointment as needed  . Providers on your designated Care Team:   . Murray Hodgkins, NP . Christell Faith, PA-C . Marrianne Mood, PA-C  Any Other Special Instructions Will Be Listed Below (If Applicable).  For educational  health videos Log in to : [email or web address] Or : [email or web address], password : [email or web address]   Exercise Stress Echocardiogram  An exercise stress echocardiogram is a test that checks how well your heart is working. For this test, you will walk on a treadmill to make your heart beat faster. This test uses sound waves (ultrasound) and a computer to make pictures (images) of your heart. These pictures will be taken before you exercise and after you exercise. What happens before the procedure?  Follow instructions from your doctor about what you cannot eat or drink before the test.  Do not drink or eat anything that has caffeine in it. Stop having caffeine for 24 hours before the test.  Ask your doctor about changing or stopping your normal medicines. This is important if you take diabetes medicines or blood thinners. Ask your doctor if you should take your medicines with water before the test.  If you use an inhaler, bring it to the test.  Do not use any products that have nicotine or tobacco in them, such as cigarettes and e-cigarettes. Stop using them for 4 hours before the test. If you need help quitting, ask your doctor.  Wear comfortable shoes and clothing. What happens during the procedure?  You will be hooked up to a TV screen. Your doctor will watch the screen to see how fast your heart beats during the test.  Before you exercise, a computer will make a picture of your heart. To do this: ?  A gel will be put on your chest. ? A wand will be moved over the gel. ? Sound waves from the wand will go to the computer to make the picture.  Your will start walking on a treadmill. The treadmill will start at a slow speed. It will get faster a little bit at a time. When you walk faster, your heart will beat faster.  The treadmill will be stopped when your heart is working hard.  You will lie down right away so another picture of your heart can be taken.  The test will take 30-60 minutes. What  happens after the procedure?  Your heart rate and blood pressure will be watched after the test.  If your doctor says that you can, you may: ? Eat what you usually eat. ? Do your normal activities. ? Take medicines like normal. Summary  An exercise stress echocardiogram is a test that checks how well your heart is working.  Follow instructions about what you cannot eat or drink before the test. Ask your doctor if you should take your normal medicines before the test.  Stop having caffeine for 24 hours before the test. Do not use anything with nicotine or tobacco in it for 4 hours before the test.  A computer will take a picture of your heart before you walk on a treadmill. It will take another picture when you are done walking.  Your heart rate and blood pressure will be watched after the test. This information is not intended to replace advice given to you by your health care provider. Make sure you discuss any questions you have with your health care provider. Document Released: 08/30/2009 Document Revised: 07/26/2016 Document Reviewed: 07/26/2016 Elsevier Interactive Patient Education  2019 [employer].

## 2019-02-08 ENCOUNTER — Telehealth: Payer: Self-pay | Admitting: Internal Medicine

## 2019-02-08 NOTE — Telephone Encounter (Signed)
Please document "normal breast MRI"  Patient notified

## 2019-08-15 ENCOUNTER — Other Ambulatory Visit: Payer: Self-pay

## 2019-08-15 ENCOUNTER — Ambulatory Visit (INDEPENDENT_AMBULATORY_CARE_PROVIDER_SITE_OTHER): Payer: 59

## 2019-08-15 ENCOUNTER — Ambulatory Visit: Payer: 59

## 2019-08-15 DIAGNOSIS — Z23 Encounter for immunization: Secondary | ICD-10-CM | POA: Diagnosis not present

## 2019-09-11 ENCOUNTER — Other Ambulatory Visit: Payer: Self-pay | Admitting: Internal Medicine

## 2019-09-26 DIAGNOSIS — H25013 Cortical age-related cataract, bilateral: Secondary | ICD-10-CM | POA: Diagnosis not present

## 2019-09-26 DIAGNOSIS — H5203 Hypermetropia, bilateral: Secondary | ICD-10-CM | POA: Diagnosis not present

## 2019-10-12 ENCOUNTER — Other Ambulatory Visit: Payer: Self-pay | Admitting: Internal Medicine

## 2019-10-12 DIAGNOSIS — M25542 Pain in joints of left hand: Secondary | ICD-10-CM

## 2019-10-23 DIAGNOSIS — M79642 Pain in left hand: Secondary | ICD-10-CM | POA: Diagnosis not present

## 2019-10-23 DIAGNOSIS — M79641 Pain in right hand: Secondary | ICD-10-CM | POA: Diagnosis not present

## 2019-10-31 MED ORDER — ESTRADIOL 0.1 MG/GM VA CREA: TOPICAL_CREAM | VAGINAL | 0 refills | Status: DC

## 2019-12-11 ENCOUNTER — Ambulatory Visit: Payer: PPO | Attending: Rheumatology | Admitting: Occupational Therapy

## 2019-12-11 ENCOUNTER — Other Ambulatory Visit: Payer: Self-pay

## 2019-12-11 ENCOUNTER — Encounter: Payer: Self-pay | Admitting: Occupational Therapy

## 2019-12-11 DIAGNOSIS — M6281 Muscle weakness (generalized): Secondary | ICD-10-CM

## 2019-12-11 DIAGNOSIS — M79642 Pain in left hand: Secondary | ICD-10-CM | POA: Insufficient documentation

## 2019-12-11 NOTE — Therapy (Signed)
[place] [address] [place], [address] Phone: [phone]   Fax:  [phone]  Occupational Therapy Evaluation  Patient Details  Name: Mackenzie Dean MRN: [id number] Date of Birth: 1950/12/12 Referring Provider (OT): Jefm Bryant   Encounter Date: 12/11/2019  OT End of Session - 12/11/19 [id number]    Visit Number  1    Number of Visits  3    Date for OT Re-Evaluation  01/08/20    OT Start Time  1100    OT Stop Time  1207    OT Time Calculation (min)  67 min    Activity Tolerance  Patient tolerated treatment well    Behavior During Therapy  [facility] for tasks assessed/performed       Past Medical History:  Diagnosis Date  . Allergy   . Breast cancer ([place]) 2001   chemo and radiation  . Cancer ([place]) 2001   Breast  . Chicken pox   . Heart murmur    [employer]  . Hyperlipidemia   . Personal history of chemotherapy 2001   left breast ca  . Personal history of radiation therapy 2001   left breast ca    Past Surgical History:  Procedure Laterality Date  . BREAST BIOPSY Right 2001   negative  . BREAST BIOPSY Left 2001   breast cancer invasive stage 1 ER +  . BREAST LUMPECTOMY Left 2001   invasive mammary carcinoma  . BREAST SURGERY Left 2002  . DILATION AND CURETTAGE OF UTERUS      There were no vitals filed for this visit.  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, help with grandkids, Haematologist , reading , biking  and workout trainer 2 x wk       AROM   Left Wrist Extension  72 Degrees    Left Wrist Flexion  72 Degrees   pain 3/10   Left Wrist Radial Deviation  15 Degrees   pain 3/10   Left Wrist Ulnar Deviation  30 Degrees      Strength   Right Hand Grip (lbs)  60    Right Hand Lateral Pinch  15 lbs    Right Hand 3 Point Pinch  10 lbs    Left Hand Grip (lbs)  50    Left Hand Lateral Pinch  15 lbs    Left Hand 3 Point Pinch  9 lbs   pain - CMC neoprene no pain and 11 lbs               OT Treatments/Exercises (OP) - 12/11/19 0001      RUE Paraffin   Number Minutes Paraffin  8 Minutes    RUE Paraffin Location  Hand    Comments  prior to review of HEP - pain decrease       LUE Paraffin   Number Minutes Paraffin  8 Minutes    LUE Paraffin Location  Hand    Comments  prior to review of HEP and decrease pain         Paraffin bath am prior to ROM and pm for pain  CMC neoprene splint for activities that cause pain   Tendon glides pain free Opposition to all digits  And thumb PA and RA  AROM - and pain free  2 x day   Joint protection and AE education done and hand out provided       OT Education - 12/11/19 1223    Education Details  findings of eval and HEP    Person(s) Educated  Patient    Methods  Explanation;Demonstration;Tactile cues;Verbal cues;Handout    Comprehension  Verbal cues required;Returned demonstration;Verbalized understanding       OT Short Term Goals - 12/11/19 1229      OT SHORT TERM GOAL #1   Title  Pt to be independent in HEP/joint protection and AE  to decrease pain to less than 3/10 at the worse    Baseline  pain 3-7/10 with use    Time  2    Period  Weeks    Status  New    Target Date  12/25/19        OT Long Term Goals - 12/11/19 1230      OT LONG TERM GOAL #1   Title  Pt report use of 3 AE and joint protection to decrease pain in L thumb and increase ease in ADL's  and IADL's using L hand    Baseline  no knowledge - using hands to much and pain increase to 7/10    Time  4    Period  Weeks    Status  New    Target Date  01/08/20      OT LONG TERM GOAL #2   Title  Pt 3 point grip increase 2 lbs with use of CMC neoprene and with less pain    Baseline  3 point on L 9 lbs , pain 3/10 at the best    Time  4    Period  Weeks    Status  New    Target Date  01/08/20            Plan - 12/11/19  1224    Clinical Impression Statement  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- positive grinding test- Finkelstein and tenderness over distal radius was negative - pt report of pain with use of L hand /thumb - Pt show great AROM of digits and grip /lat grip is on upper and in range for her age - she do have pain with thumb PA and RA end range , 3 point grip was painfull and lower range for her age - limiting her functional use of L hand in ADL's and IADL's    OT Occupational Profile and History  Problem Focused Assessment - Including review of records relating to presenting problem    Occupational performance deficits (Please refer to evaluation for details):  ADL's;IADL's;Play;Leisure;Social Participation    Body  Structure / Function / Physical Skills  ADL;UE functional use;Decreased knowledge of use of DME;Pain;Strength;IADL    Rehab Potential  Good    Clinical Decision Making  Limited treatment options, no task modification necessary    Comorbidities Affecting Occupational Performance:  May have comorbidities impacting occupational performance   OA chronic condition   Modification or Assistance to Complete Evaluation   No modification of tasks or assist necessary to complete eval    OT Frequency  Biweekly    OT Duration  4 weeks    OT Treatment/Interventions  Self-care/ADL training;Therapeutic exercise;Paraffin;Splinting;Patient/family education;DME and/or AE instruction;Manual Therapy    Plan  progress with homeprogram    OT Home Exercise Plan  see pt instruction    Consulted and Agree with Plan of Care  Patient       Patient will benefit from skilled therapeutic intervention in order to improve the following deficits and impairments:   Body Structure / Function / Physical Skills: ADL, UE functional use, Decreased knowledge of use of DME, Pain, Strength, IADL       Visit Diagnosis: Pain in left hand - Plan: Ot plan of care cert/re-cert  Muscle weakness (generalized) - Plan: Ot plan of care cert/re-cert    Problem List Patient Active Problem List   Diagnosis Date Noted  . Paroxysmal tachycardia ([place]) 02/01/2019  . Mixed hyperlipidemia 02/01/2019  . Thoracic aortic atherosclerosis ([place]) 06/25/2015  . Visit for preventive health examination 06/12/2014  . H/O osteopenia 06/11/2014  . Palpitations 06/11/2014  . Chest pain 06/11/2014  . History of breast cancer in female 12/02/2013  . Unspecified constipation 11/30/2013    Rosalyn Gess OTR/L,CLT 12/11/2019, 12:34 PM  [place] [facility] [address], [place], [address] Phone: [phone]   Fax:  [phone]  Name: ZADIE VETRANO MRN: [id number] Date of Birth:  1951/08/02

## 2019-12-11 NOTE — Patient Instructions (Signed)
Paraffin bath am prior to ROM and pm for pain  CMC neoprene splint for activities that cause pain   Tendon glides pain free Opposition to all digits  And thumb PA and RA  AROM - and pain free  2 x day   Joint protection and AE education done and hand out provided

## 2019-12-25 ENCOUNTER — Other Ambulatory Visit: Payer: Self-pay

## 2019-12-25 ENCOUNTER — Ambulatory Visit: Payer: PPO | Attending: Rheumatology | Admitting: Occupational Therapy

## 2019-12-25 DIAGNOSIS — M79642 Pain in left hand: Secondary | ICD-10-CM

## 2019-12-25 DIAGNOSIS — M6281 Muscle weakness (generalized): Secondary | ICD-10-CM | POA: Diagnosis not present

## 2019-12-25 NOTE — Patient Instructions (Signed)
Cont with same HEP  

## 2019-12-25 NOTE — Therapy (Signed)
[place] [facility] [address] [place], [address] Phone: [phone]   Fax:  [phone]  Occupational Therapy Treatment  Patient Details  Name: Mackenzie Dean MRN: [id number] Date of Birth: 07/16/1951 Referring Provider (OT): Jefm Bryant   Encounter Date: 12/25/2019  OT End of Session - 12/25/19 1056    Visit Number  2    Number of Visits  2    Date for OT Re-Evaluation  12/25/19    OT Start Time  1115    OT Stop Time  1151    OT Time Calculation (min)  36 min    Activity Tolerance  Patient tolerated treatment well    Behavior During Therapy  [facility] for tasks assessed/performed       Past Medical History:  Diagnosis Date  . Allergy   . Breast cancer ([place]) 2001   chemo and radiation  . Cancer ([place]) 2001   Breast  . Chicken pox   . Heart murmur    [employer]  . Hyperlipidemia   . Personal history of chemotherapy 2001   left breast ca  . Personal history of radiation therapy 2001   left breast ca    Past Surgical History:  Procedure Laterality Date  . BREAST BIOPSY Right 2001   negative  . BREAST BIOPSY Left 2001   breast cancer invasive stage 1 ER +  . BREAST LUMPECTOMY Left 2001   invasive mammary carcinoma  . BREAST SURGERY Left 2002  . DILATION AND CURETTAGE OF UTERUS      There were no vitals filed for this visit.  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  Yes    Pain Score  1     Pain Location  --   thumb   Pain Orientation  Left    Pain Descriptors / Indicators  Aching    Pain Type  Chronic pain         [facility] OT Assessment - 12/25/19 0001      AROM   Left Wrist Flexion  70 Degrees   no pain   Left Wrist Radial Deviation  15 Degrees   no pain      Strength   Right Hand Grip (lbs)  60    Right Hand Lateral Pinch  15 lbs    Right Hand 3 Point Pinch  14 lbs    Left Hand Grip (lbs)  55    Left Hand Lateral Pinch  15 lbs    Left Hand 3 Point Pinch  12 lbs       Pt report great progress in use of hand , pain and usiing joint protection principles   pain decrease -  and planning to order some AE - for garden and spring loaded scissors  And did start using paraffin in am and pm - with great results  Using [facility] neoprene splint on L hand -and decrease pain and able to use with grand kids  3 point grip increase bilateral and grip on R increase 5 lbs  See flowsheet         OT Treatments/Exercises (OP) - 12/25/19 0001      RUE Paraffin   Number Minutes Paraffin  8 Minutes    RUE Paraffin Location  Hand    Comments  prior to soft tissue      LUE Paraffin   Number Minutes Paraffin  8 Minutes    LUE Paraffin Location  Hand    Comments  prior to soft tissue       soft tissue mobs done to webspace and with thumb PA and RA - no pain  Assess flexibility in wrist and thumb - afterwards - pt in upper range for her age for strength  Review AROM for tendon glides and opposition -to do AROM - no force movement  Fitted with CMC neoprene splint for R hand - to use for biking and prevention         OT Education - 12/25/19 1055    Education Details  review again HEP , splint and paraffin use -and AE and joint protection    Person(s) Educated  Patient    Methods  Explanation;Demonstration;Tactile cues;Verbal cues;Handout    Comprehension  Verbal  cues required;Returned demonstration;Verbalized understanding       OT Short Term Goals - 12/25/19 1059      OT SHORT TERM GOAL #1   Title  Pt to be independent in HEP/joint protection and AE  to decrease pain to less than 3/10 at the worse    Baseline  pain 1-2/10 - and using joint protection and HEP    Status  Achieved        OT Long Term Goals - 12/25/19 1059      OT LONG TERM GOAL #1   Title  Pt report use of 3 AE and joint protection to decrease pain in L thumb and increase ease in ADL's  and IADL's using L hand    Baseline  using joint protection and splint , HEP -and pain 1-2/10    Status  Achieved      OT LONG TERM GOAL #2   Title  Pt 3 point grip increase 2 lbs with use of CMC neoprene and with less pain    Baseline  L grip increase 5 lbs , and 3 point grip R increase 4 lbs , L 3 lbs - no pain    Status  Achieved            Plan - 12/25/19 1057    Clinical Impression Statement  Pt with L thumb CMC OA - pt report great progress in her pain and functionals use of L hand - pt report she is using joint protection in her daily act ,and did get paraffin bath to use am and pm as needed -and also cont to use her neoprene CMC splint for activities that cannot be modified - her pain decrease with her AROM of thumb and wrist on L , and show increase 3 point grip in bilateral hands and increase grip on R hand - pt met all goals and discharge at this time with HEP    OT Occupational Profile  and History  Problem Focused Assessment - Including review of records relating to presenting problem    Occupational performance deficits (Please refer to evaluation for details):  ADL's;IADL's;Play;Leisure;Social Participation    Body Structure / Function / Physical Skills  ADL;UE functional use;Decreased knowledge of use of DME;Pain;Strength;IADL    Rehab Potential  Good    Clinical Decision Making  Limited treatment options, no task modification necessary    OT Treatment/Interventions   Self-care/ADL training;Therapeutic exercise;Paraffin;Splinting;Patient/family education;DME and/or AE instruction;Manual Therapy    Plan  Discharge with homeprogram    OT Home Exercise Plan  see pt instruction    Consulted and Agree with Plan of Care  Patient       Patient will benefit from skilled therapeutic intervention in order to improve the following deficits and impairments:   Body Structure / Function / Physical Skills: ADL, UE functional use, Decreased knowledge of use of DME, Pain, Strength, IADL       Visit Diagnosis: Muscle weakness (generalized)  Pain in left hand    Problem List Patient Active Problem List   Diagnosis Date Noted  . Paroxysmal tachycardia ([place]) 02/01/2019  . Mixed hyperlipidemia 02/01/2019  . Thoracic aortic atherosclerosis ([place]) 06/25/2015  . Visit for preventive health examination 06/12/2014  . H/O osteopenia 06/11/2014  . Palpitations 06/11/2014  . Chest pain 06/11/2014  . History of breast cancer in female 12/02/2013  . Unspecified constipation 11/30/2013    Rosalyn Gess OTR/L,CLT 12/25/2019, 11:01 AM  [place] [facility] [address] [place], [address] Phone: [phone]   Fax:  [phone]  Name: Mackenzie Dean MRN: [id number] Date of Birth: 04/06/1951

## 2020-01-15 DIAGNOSIS — D1801 Hemangioma of skin and subcutaneous tissue: Secondary | ICD-10-CM | POA: Diagnosis not present

## 2020-01-15 DIAGNOSIS — L578 Other skin changes due to chronic exposure to nonionizing radiation: Secondary | ICD-10-CM | POA: Diagnosis not present

## 2020-01-15 DIAGNOSIS — L821 Other seborrheic keratosis: Secondary | ICD-10-CM | POA: Diagnosis not present

## 2020-01-15 DIAGNOSIS — D225 Melanocytic nevi of trunk: Secondary | ICD-10-CM | POA: Diagnosis not present

## 2020-01-15 DIAGNOSIS — Z1283 Encounter for screening for malignant neoplasm of skin: Secondary | ICD-10-CM | POA: Diagnosis not present

## 2020-01-15 DIAGNOSIS — B360 Pityriasis versicolor: Secondary | ICD-10-CM | POA: Diagnosis not present

## 2020-01-15 DIAGNOSIS — Z85828 Personal history of other malignant neoplasm of skin: Secondary | ICD-10-CM | POA: Diagnosis not present

## 2020-02-15 ENCOUNTER — Other Ambulatory Visit: Payer: Self-pay | Admitting: Cardiovascular Disease

## 2020-03-19 ENCOUNTER — Other Ambulatory Visit: Payer: Self-pay | Admitting: Internal Medicine

## 2020-04-03 ENCOUNTER — Other Ambulatory Visit: Payer: Self-pay | Admitting: Internal Medicine

## 2020-04-03 DIAGNOSIS — Z1231 Encounter for screening mammogram for malignant neoplasm of breast: Secondary | ICD-10-CM

## 2020-04-17 ENCOUNTER — Ambulatory Visit
Admission: RE | Admit: 2020-04-17 | Discharge: 2020-04-17 | Disposition: A | Discharge status: Home / Self Care | Payer: PPO | Source: Ambulatory Visit | Attending: Internal Medicine | Admitting: Internal Medicine

## 2020-04-17 DIAGNOSIS — Z1231 Encounter for screening mammogram for malignant neoplasm of breast: Secondary | ICD-10-CM | POA: Insufficient documentation

## 2020-05-16 DIAGNOSIS — Z01419 Encounter for gynecological examination (general) (routine) without abnormal findings: Secondary | ICD-10-CM | POA: Diagnosis not present

## 2020-05-17 DIAGNOSIS — Z01419 Encounter for gynecological examination (general) (routine) without abnormal findings: Secondary | ICD-10-CM | POA: Diagnosis not present

## 2020-06-11 ENCOUNTER — Other Ambulatory Visit: Payer: Self-pay

## 2020-08-12 ENCOUNTER — Telehealth: Payer: Self-pay | Admitting: Internal Medicine

## 2020-08-12 DIAGNOSIS — Z0000 Encounter for general adult medical examination without abnormal findings: Secondary | ICD-10-CM

## 2020-08-12 DIAGNOSIS — E782 Mixed hyperlipidemia: Secondary | ICD-10-CM

## 2020-08-12 NOTE — Telephone Encounter (Signed)
Pt wants to know if she needs to get labs before her appt on 09/16/20 or after? I let her know without lab orders I am not supposed to schedule.

## 2020-08-14 NOTE — Telephone Encounter (Signed)
Pt would like to have labs done before her appt on 09/16/2020. I have ordered Lipid panel, cmp, cbc, tsh and a1c. Is there anything else that needs to be ordered?

## 2020-09-16 ENCOUNTER — Other Ambulatory Visit: Payer: Self-pay

## 2020-09-16 ENCOUNTER — Ambulatory Visit (INDEPENDENT_AMBULATORY_CARE_PROVIDER_SITE_OTHER): Payer: PPO | Admitting: Internal Medicine

## 2020-09-16 ENCOUNTER — Encounter: Payer: Self-pay | Admitting: Internal Medicine

## 2020-09-16 VITALS — BP 122/70 | HR 84 | Temp 98.3°F | Ht 64.33 in | Wt 119.8 lb

## 2020-09-16 DIAGNOSIS — R002 Palpitations: Secondary | ICD-10-CM | POA: Diagnosis not present

## 2020-09-16 DIAGNOSIS — Z8739 Personal history of other diseases of the musculoskeletal system and connective tissue: Secondary | ICD-10-CM

## 2020-09-16 DIAGNOSIS — H2633 Drug-induced cataract, bilateral: Secondary | ICD-10-CM

## 2020-09-16 DIAGNOSIS — I700 Atherosclerosis of aorta: Secondary | ICD-10-CM | POA: Diagnosis not present

## 2020-09-16 DIAGNOSIS — Z23 Encounter for immunization: Secondary | ICD-10-CM | POA: Diagnosis not present

## 2020-09-16 DIAGNOSIS — I479 Paroxysmal tachycardia, unspecified: Secondary | ICD-10-CM | POA: Diagnosis not present

## 2020-09-16 DIAGNOSIS — M8580 Other specified disorders of bone density and structure, unspecified site: Secondary | ICD-10-CM | POA: Diagnosis not present

## 2020-09-16 DIAGNOSIS — E782 Mixed hyperlipidemia: Secondary | ICD-10-CM | POA: Diagnosis not present

## 2020-09-16 DIAGNOSIS — Z853 Personal history of malignant neoplasm of breast: Secondary | ICD-10-CM | POA: Diagnosis not present

## 2020-09-16 DIAGNOSIS — Z0000 Encounter for general adult medical examination without abnormal findings: Secondary | ICD-10-CM

## 2020-09-16 NOTE — Patient Instructions (Signed)
FOR the palpitations:  Home sleep study ordered to  rule out OSA Consider 1/2 propranolol at bedtime PREEMPTIVELY   DEXA SCAN ORDERED   RETURN FOR  LABS EVERY 6 MONTHS    Health Maintenance After Age 67 After age 8, you are at a higher risk for certain long-term diseases and infections as well as injuries from falls. Falls are a major cause of broken bones and head injuries in people who are older than age 68. Getting regular preventive care can help to keep you healthy and well. Preventive care includes getting regular testing and making lifestyle changes as recommended by your health care provider. Talk with your health care provider about:  Which screenings and tests you should have. A screening is a test that checks for a disease when you have no symptoms.  A diet and exercise plan that is right for you. What should I know about screenings and tests to prevent falls? Screening and testing are the best ways to find a health problem early. Early diagnosis and treatment give you the best chance of managing medical conditions that are common after age 85. Certain conditions and lifestyle choices may make you more likely to have a fall. Your health care provider may recommend:  Regular vision checks. Poor vision and conditions such as cataracts can make you more likely to have a fall. If you wear glasses, make sure to get your prescription updated if your vision changes.  Medicine review. Work with your health care provider to regularly review all of the medicines you are taking, including over-the-counter medicines. Ask your health care provider about any side effects that may make you more likely to have a fall. Tell your health care provider if any medicines that you take make you feel dizzy or sleepy.  Osteoporosis screening. Osteoporosis is a condition that causes the bones to get weaker. This can make the bones weak and cause them to break more easily.  Blood pressure screening. Blood  pressure changes and medicines to control blood pressure can make you feel dizzy.  Strength and balance checks. Your health care provider may recommend certain tests to check your strength and balance while standing, walking, or changing positions.  Foot health exam. Foot pain and numbness, as well as not wearing proper footwear, can make you more likely to have a fall.  Depression screening. You may be more likely to have a fall if you have a fear of falling, feel emotionally low, or feel unable to do activities that you used to do.  Alcohol use screening. Using too much alcohol can affect your balance and may make you more likely to have a fall. What actions can I take to lower my risk of falls? General instructions  Talk with your health care provider about your risks for falling. Tell your health care provider if: ? You fall. Be sure to tell your health care provider about all falls, even ones that seem minor. ? You feel dizzy, sleepy, or off-balance.  Take over-the-counter and prescription medicines only as told by your health care provider. These include any supplements.  Eat a healthy diet and maintain a healthy weight. A healthy diet includes low-fat dairy products, low-fat (lean) meats, and fiber from whole grains, beans, and lots of fruits and vegetables. Home safety  Remove any tripping hazards, such as rugs, cords, and clutter.  Install safety equipment such as grab bars in bathrooms and safety rails on stairs.  Keep rooms and walkways well-lit. Activity  Follow a regular exercise program to stay fit. This will help you maintain your balance. Ask your health care provider what types of exercise are appropriate for you.  If you need a cane or walker, use it as recommended by your health care provider.  Wear supportive shoes that have nonskid soles. Lifestyle  Do not drink alcohol if your health care provider tells you not to drink.  If you drink alcohol, limit how  much you have: ? 0-1 drink a day for women. ? 0-2 drinks a day for men.  Be aware of how much alcohol is in your drink. In the [place] one drink equals one typical bottle of beer (12 oz), one-half glass of wine (5 oz), or one shot of hard liquor (1 oz).  Do not use any products that contain nicotine or tobacco, such as cigarettes and e-cigarettes. If you need help quitting, ask your health care provider. Summary  Having a healthy lifestyle and getting preventive care can help to protect your health and wellness after age 54.  Screening and testing are the best way to find a health problem early and help you avoid having a fall. Early diagnosis and treatment give you the best chance for managing medical conditions that are more common for people who are older than age 48.  Falls are a major cause of broken bones and head injuries in people who are older than age 79. Take precautions to prevent a fall at home.  Work with your health care provider to learn what changes you can make to improve your health and wellness and to prevent falls. This information is not intended to replace advice given to you by your health care provider. Make sure you discuss any questions you have with your health care provider. Document Revised: 02/23/2019 Document Reviewed: 09/15/2017 Elsevier Patient Education  2020 [employer].

## 2020-09-16 NOTE — Progress Notes (Signed)
Patient ID: Mackenzie Dean, female    DOB: [birth year]  Age: 69 y.o. MRN: [id number]  The patient is here for annual PREVENTIVE examination and management of other chronic and acute problems.  This visit occurred during the SARS-CoV-2 public health emergency.  Safety protocols were in place, including screening questions prior to the visit, additional usage of staff PPE, and extensive cleaning of exam room while observing appropriate contact time as indicated for disinfecting solutions.    Patient has received both doses of the available COVID 19 vaccine without complications.  Patient continues to mask when outside of the home except when walking in yard or at safe distances from others .  Patient denies any change in mood or development of unhealthy behaviors resuting from the pandemic's restriction of activities and socialization.     The risk factors are reflected in the social Mackenzie.  The roster of all physicians providing medical care to patient - is listed in the Snapshot section of the chart.  Health Maintenance Due  Topic Date Due  . PNA vac Low Risk Adult (1 of 2 - PCV13) Never done    Activities of daily living:  The patient is 100% independent in all ADLs: dressing, toileting, feeding as well as independent mobility  Home safety : The patient has smoke detectors in the home. They wear seatbelts.  There are no firearms at home. There is no violence in the home.   There is no risks for hepatitis, STDs or HIV. There is no   Mackenzie of blood transfusion. They have no travel Mackenzie to infectious disease endemic areas of the world.  The patient has seen their dentist in the last six month. They have seen their eye doctor in the last year. They admit to slight hearing difficulty with regard to whispered voices and some television programs.  They have deferred audiologic testing in the last year.  They do not  have excessive sun exposure. Discussed the need for sun protection: hats,  long sleeves and use of sunscreen if there is significant sun exposure.   Diet: the importance of a healthy diet is discussed. They do have a healthy diet.  The benefits of regular aerobic exercise were discussed. She walks 4 times per week ,  20 minutes.   Depression screen: there are no signs or vegative symptoms of depression- irritability, change in appetite, anhedonia, sadness/tearfullness.  Cognitive assessment: the patient manages all their financial and personal affairs and is actively engaged. They could relate day,date,year and events; recalled 2/3 objects at 3 minutes; performed clock-face test normally.  The following portions of the patient's Mackenzie were reviewed and updated as appropriate: allergies, current medications, past family Mackenzie, past medical Mackenzie,  past surgical Mackenzie, past social Mackenzie  and problem list.  Visual acuity was not assessed per patient preference since she has regular follow up with her ophthalmologist. Hearing and body mass index were assessed and reviewed.   During the course of the visit the patient was educated and counseled about appropriate screening and preventive services including : fall prevention , diabetes screening, nutrition counseling, colorectal cancer screening, and recommended immunizations.    CC: The primary encounter diagnosis was Visit for preventive health examination. 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   Hyperlipidemia/aortic atherosclerosis :  She decided to start statin therapy last year after seeing Dr Rockey Situ for evaluation of tachycardia.  She has not had follow up labs since starting the medication .  She has had a recent recurrence of nocturnal tachycardia but  attributes the recurrence to increased stressors  (work, family)   No nightmares .  Told she snores,  No Mackenzie of OSA , no prior testing   Last colonoscopy NOT IN CHART   Says it was done by Dr Vira Agar at [facility] 2018 . Records requested. No biopsy report in chart either.   BILATERAL CATARACTS; limiting night driving but otherwise asymptomatic   OSTEOPENIA:  Taking Evista chronically   Mackenzie of BRCA:  No recurrence thus far,  Treated with lumpectomy , XRT and chemo  in 2015.  Having mammogram alternating with MRI mammogram normal June 2021 .  Follow up is occurring locally with Dr Ouida Sills   Mackenzie Dean has a past medical Mackenzie of Allergy, Breast cancer ([place]) (2001), Cancer ([place]) (2001), Chicken pox, Heart murmur, Hyperlipidemia, Personal Mackenzie of chemotherapy (2001), and Personal Mackenzie of radiation therapy (2001).   She has a past surgical Mackenzie that includes Breast surgery (Left, 2002); Dilation and curettage of uterus; Breast lumpectomy (Left, 2001); Breast biopsy (Right, 2001); and Breast biopsy (Left, 2001).   Her family Mackenzie includes Arrhythmia in her mother; Breast cancer in her cousin and cousin; Dementia in her mother; Heart disease in her father and mother; Heart failure in her paternal aunt; Mental retardation in her mother; Stroke in her father.She reports that she is a non-smoker but has been exposed to tobacco smoke. She has never used smokeless tobacco. She reports current alcohol use of about 1.0 standard drink of alcohol per week. She reports that she does not use drugs.  Outpatient Medications Prior to Visit  Medication Sig Dispense Refill  . Calcium Carbonate (CALCIUM 500 PO) Take by mouth daily in the afternoon.    . cetirizine (ZYRTEC) 10 MG tablet Take 10 mg by mouth daily.    Marland Kitchen estradiol (ESTRACE VAGINAL) 0.1 MG/GM vaginal cream PLACE 1 APPLICATORFUL VAGINALLY 2 TIMES A WEEK. 127.5 g 0  . Multiple Vitamins-Minerals (MULTIVITAMIN PO) Take 1 capsule  by mouth daily.    . propranolol (INDERAL) 20 MG tablet Take 1 tablet (20 mg total) by mouth 3 (three) times daily as needed. 90 tablet 3  . raloxifene (EVISTA) 60 MG tablet TAKE 1 TABLET BY MOUTH DAILY. 90 tablet 1  . rosuvastatin (CRESTOR) 5 MG tablet TAKE 1 TABLET BY MOUTH DAILY. 90 tablet 3  . aspirin 81 MG tablet Take 81 mg by mouth daily.     . calcium-vitamin D (OSCAL WITH D) 500-200 MG-UNIT per tablet Take 1 tablet by mouth daily with breakfast.    . magnesium 30 MG tablet Take 30 mg by mouth 2 (two) times daily.    . Omega-3 Fatty Acids (FISH OIL) 1200 MG CAPS Take 1 capsule by mouth 2 (two) times daily.     No facility-administered medications prior to visit.    Review of Systems  Objective:  BP 122/70   Pulse 84   Temp 98.3 F (36.8 C) (Oral)   Ht 5' 4.33" (1.634 m)   Wt 119 lb 12.8 oz (54.3 kg)   SpO2 98%   BMI 20.35 kg/m   Physical Exam    Assessment & Plan:   Problem List Items Addressed This Visit      Unprioritized   Mackenzie of breast cancer in  female    Her last mammogram was normal June 2021; continue alternating with MRI .  Continue Evista       Palpitations   Relevant Orders   Home sleep test   Visit for preventive health examination - Primary    age appropriate education and counseling updated, referrals for preventative services and immunizations addressed, dietary and smoking counseling addressed, most recent labs reviewed.  I have personally reviewed and have noted:  1) the patient's medical and social Mackenzie 2) The pt's use of alcohol, tobacco, and illicit drugs 3) The patient's current medications and supplements 4) Functional ability including ADL's, fall risk, home safety risk, hearing and visual impairment 5) Diet and physical activities 6) Evidence for depression or mood disorder 7) The patient's height, weight, and BMI have been recorded in the chart  I have made referrals, and provided counseling and education based on review of  the above      Thoracic aortic atherosclerosis ([place])    Noted on CT chest / coronary CT;   calcium score was zero .Marland Kitchen  She has been tolerating  rosuvastatin since march 2020 without follow up labs, prescribed by Dr Rockey Situ,         Paroxysmal tachycardia ([place])    Recurrent of late,  Recommend home sleep study and prophylactic inderal 10 mg qhs       Mixed hyperlipidemia    10 yr risk has never been elevated but given thoracic aortic atherosclerosis noted on Cardiac CT done last year, she started taking 5 mg Crestor.  Follow up labs due        Bilateral cataracts   Osteopenia due to cancer therapy    Continue Evista , started in 2007 .  DEXA due       Relevant Orders   DG Bone Density   RESOLVED: H/O osteopenia    Continue Evista , started in 2007 .  Repeat DEXA due.       Other Visit Diagnoses    Needs flu shot       Relevant Orders   Flu Vaccine QUAD High Dose(Fluad) (Completed)      I have discontinued Lona Kettle. Logan's Fish Oil, calcium-vitamin D, aspirin, and magnesium. I am also having her maintain her cetirizine, Multiple Vitamins-Minerals (MULTIVITAMIN PO), propranolol, estradiol, rosuvastatin, raloxifene, and Calcium Carbonate (CALCIUM 500 PO).  No orders of the defined types were placed in this encounter.   Medications Discontinued During This Encounter  Medication Reason  . Omega-3 Fatty Acids (FISH OIL) 1200 MG CAPS Patient Preference  . aspirin 81 MG tablet Patient Preference  . calcium-vitamin D (OSCAL WITH D) 500-200 MG-UNIT per tablet Patient Preference  . magnesium 30 MG tablet Patient Preference    Follow-up: Return in about 6 months (around 03/16/2021).   Crecencio Mc, MD

## 2020-09-17 DIAGNOSIS — H269 Unspecified cataract: Secondary | ICD-10-CM | POA: Insufficient documentation

## 2020-09-17 DIAGNOSIS — M810 Age-related osteoporosis without current pathological fracture: Secondary | ICD-10-CM | POA: Insufficient documentation

## 2020-09-17 DIAGNOSIS — M8580 Other specified disorders of bone density and structure, unspecified site: Secondary | ICD-10-CM | POA: Insufficient documentation

## 2020-09-17 LAB — CBC WITH DIFFERENTIAL/PLATELET
Basophils Absolute: 0.1 10*3/uL (ref 0.0–0.1)
Basophils Relative: 1.4 % (ref 0.0–3.0)
Eosinophils Absolute: 0.3 10*3/uL (ref 0.0–0.7)
Eosinophils Relative: 5.5 % — ABNORMAL HIGH (ref 0.0–5.0)
HCT: 41 % (ref 36.0–46.0)
Hemoglobin: 14 g/dL (ref 12.0–15.0)
Lymphocytes Relative: 31.1 % (ref 12.0–46.0)
Lymphs Abs: 1.8 10*3/uL (ref 0.7–4.0)
MCHC: 34.1 g/dL (ref 30.0–36.0)
MCV: 91.8 fl (ref 78.0–100.0)
Monocytes Absolute: 0.5 10*3/uL (ref 0.1–1.0)
Monocytes Relative: 8 % (ref 3.0–12.0)
Neutro Abs: 3.2 10*3/uL (ref 1.4–7.7)
Neutrophils Relative %: 54 % (ref 43.0–77.0)
Platelets: 205 10*3/uL (ref 150.0–400.0)
RBC: 4.47 Mil/uL (ref 3.87–5.11)
RDW: 12.7 % (ref 11.5–15.5)
WBC: 5.8 10*3/uL (ref 4.0–10.5)

## 2020-09-17 LAB — LIPID PANEL
Cholesterol: 152 mg/dL (ref 0–200)
HDL: 54.3 mg/dL (ref >39.00)
LDL Cholesterol: 79 mg/dL (ref 0–99)
NonHDL: 98.1
Total CHOL/HDL Ratio: 3
Triglycerides: 97 mg/dL (ref 0.0–149.0)
VLDL: 19.4 mg/dL (ref 0.0–40.0)

## 2020-09-17 LAB — COMPREHENSIVE METABOLIC PANEL
ALT: 20 U/L (ref 0–35)
AST: 23 U/L (ref 0–37)
Albumin: 4.1 g/dL (ref 3.5–5.2)
Alkaline Phosphatase: 50 U/L (ref 39–117)
BUN: 19 mg/dL (ref 6–23)
CO2: 28 mEq/L (ref 19–32)
Calcium: 8.8 mg/dL (ref 8.4–10.5)
Chloride: 102 mEq/L (ref 96–112)
Creatinine, Ser: 0.77 mg/dL (ref 0.40–1.20)
GFR: 80.12 mL/min (ref >60.00)
Glucose, Bld: 79 mg/dL (ref 70–99)
Potassium: 3.6 mEq/L (ref 3.5–5.1)
Sodium: 139 mEq/L (ref 135–145)
Total Bilirubin: 0.5 mg/dL (ref 0.2–1.2)
Total Protein: 6.8 g/dL (ref 6.0–8.3)

## 2020-09-17 LAB — HEMOGLOBIN A1C: Hgb A1c MFr Bld: 5.5 % (ref 4.6–6.5)

## 2020-09-17 LAB — TSH: TSH: 0.94 u[IU]/mL (ref 0.35–4.50)

## 2020-09-17 NOTE — Assessment & Plan Note (Signed)
Continue Evista , started in 2007 .  DEXA due

## 2020-09-17 NOTE — Progress Notes (Signed)
Your CBC, thyroid ,cholesterol,  A1c, liver and kidney function are all  normal.  You are not anemic or prediabetic    Please Plan to repeat  non fasting labs in 6 months .  I neglected to ask you who was doing your annual breast exam (is it your GYN?)  Regards,  Dr. Derrel Nip

## 2020-09-17 NOTE — Assessment & Plan Note (Signed)
Her last mammogram was normal June 2021; continue alternating with MRI .  Continue Evista

## 2020-09-17 NOTE — Assessment & Plan Note (Signed)
Continue Evista , started in 2007 .  Repeat DEXA due.

## 2020-09-17 NOTE — Assessment & Plan Note (Signed)
10 yr risk has never been elevated but given thoracic aortic atherosclerosis noted on Cardiac CT done last year, she started taking 5 mg Crestor.  Follow up labs due

## 2020-09-17 NOTE — Assessment & Plan Note (Signed)
Noted on CT chest / coronary CT;   calcium score was zero .Marland Kitchen  She has been tolerating  rosuvastatin since march 2020 without follow up labs, prescribed by Dr Rockey Situ,

## 2020-09-17 NOTE — Assessment & Plan Note (Signed)
Recurrent of late,  Recommend home sleep study and prophylactic inderal 10 mg qhs

## 2020-09-17 NOTE — Assessment & Plan Note (Signed)

## 2020-09-30 ENCOUNTER — Encounter: Payer: Self-pay | Admitting: Internal Medicine

## 2020-10-21 ENCOUNTER — Other Ambulatory Visit: Payer: Self-pay | Admitting: Internal Medicine

## 2020-10-21 ENCOUNTER — Other Ambulatory Visit: Payer: Self-pay

## 2020-10-21 ENCOUNTER — Ambulatory Visit
Admission: RE | Admit: 2020-10-21 | Discharge: 2020-10-21 | Disposition: A | Discharge status: Home / Self Care | Payer: PPO | Source: Ambulatory Visit | Attending: Internal Medicine | Admitting: Internal Medicine

## 2020-10-21 DIAGNOSIS — Z853 Personal history of malignant neoplasm of breast: Secondary | ICD-10-CM | POA: Diagnosis not present

## 2020-10-21 DIAGNOSIS — Z780 Asymptomatic menopausal state: Secondary | ICD-10-CM | POA: Diagnosis not present

## 2020-10-21 DIAGNOSIS — M8588 Other specified disorders of bone density and structure, other site: Secondary | ICD-10-CM | POA: Diagnosis not present

## 2020-10-21 DIAGNOSIS — Z9221 Personal history of antineoplastic chemotherapy: Secondary | ICD-10-CM | POA: Insufficient documentation

## 2020-10-21 DIAGNOSIS — Z13820 Encounter for screening for osteoporosis: Secondary | ICD-10-CM | POA: Diagnosis not present

## 2020-10-21 DIAGNOSIS — Z923 Personal history of irradiation: Secondary | ICD-10-CM | POA: Diagnosis not present

## 2020-10-21 DIAGNOSIS — M85852 Other specified disorders of bone density and structure, left thigh: Secondary | ICD-10-CM | POA: Diagnosis not present

## 2020-10-21 DIAGNOSIS — M8580 Other specified disorders of bone density and structure, unspecified site: Secondary | ICD-10-CM | POA: Diagnosis not present

## 2020-10-22 ENCOUNTER — Encounter: Payer: Self-pay | Admitting: Internal Medicine

## 2020-10-22 ENCOUNTER — Other Ambulatory Visit: Payer: Self-pay | Admitting: Internal Medicine

## 2020-10-22 DIAGNOSIS — M810 Age-related osteoporosis without current pathological fracture: Secondary | ICD-10-CM

## 2020-10-22 NOTE — Progress Notes (Signed)
Mackenzie Dean,  I hope you had a wonderful Thanksgiving!  I wanted to let you know that your bone density has continued to decline despite use of Evista since 2007,  and you now technically have osteoporosis,  so we should meet to discuss  what to do about this. Please schedule a visit and plan to have your vitamin D level assessed first, ( I have ordered the lab)  Regards,   Deborra Medina, MD

## 2020-12-25 ENCOUNTER — Telehealth: Payer: Self-pay | Admitting: Internal Medicine

## 2020-12-25 NOTE — Telephone Encounter (Signed)
Left message for patient to call back and schedule Medicare Annual Wellness Visit (AWV)   This should be a virtual visit only=30 minutes.  No hx of AWV; please schedule at anytime with Denisa O'Brien-Blaney at [facility]   

## 2021-01-16 ENCOUNTER — Encounter: Payer: PPO | Admitting: Dermatology

## 2021-01-28 ENCOUNTER — Other Ambulatory Visit: Payer: Self-pay | Admitting: Internal Medicine

## 2021-01-28 DIAGNOSIS — E782 Mixed hyperlipidemia: Secondary | ICD-10-CM

## 2021-01-28 DIAGNOSIS — M810 Age-related osteoporosis without current pathological fracture: Secondary | ICD-10-CM

## 2021-01-28 DIAGNOSIS — E559 Vitamin D deficiency, unspecified: Secondary | ICD-10-CM

## 2021-03-06 ENCOUNTER — Other Ambulatory Visit: Payer: Self-pay | Admitting: Cardiovascular Disease

## 2021-03-06 NOTE — Telephone Encounter (Signed)
LMOV to schedule  

## 2021-03-06 NOTE — Telephone Encounter (Signed)
Please call pt to schedule follow-up appointment for refills. Thanks!

## 2021-03-07 ENCOUNTER — Other Ambulatory Visit: Payer: Self-pay

## 2021-03-07 MED ORDER — ROSUVASTATIN CALCIUM 5 MG PO TABS
5.0000 mg | ORAL_TABLET | Freq: Every day | ORAL | 0 refills | Status: DC
  Filled 2021-03-07: qty 30, 30d supply, fill #0

## 2021-03-10 ENCOUNTER — Other Ambulatory Visit: Payer: Self-pay

## 2021-03-14 ENCOUNTER — Other Ambulatory Visit (INDEPENDENT_AMBULATORY_CARE_PROVIDER_SITE_OTHER): Payer: PPO

## 2021-03-14 ENCOUNTER — Other Ambulatory Visit: Payer: Self-pay

## 2021-03-14 DIAGNOSIS — M810 Age-related osteoporosis without current pathological fracture: Secondary | ICD-10-CM

## 2021-03-14 DIAGNOSIS — E782 Mixed hyperlipidemia: Secondary | ICD-10-CM

## 2021-03-14 DIAGNOSIS — E559 Vitamin D deficiency, unspecified: Secondary | ICD-10-CM

## 2021-03-14 LAB — COMPREHENSIVE METABOLIC PANEL
ALT: 16 U/L (ref 0–35)
AST: 24 U/L (ref 0–37)
Albumin: 4.4 g/dL (ref 3.5–5.2)
Alkaline Phosphatase: 53 U/L (ref 39–117)
BUN: 16 mg/dL (ref 6–23)
CO2: 32 mEq/L (ref 19–32)
Calcium: 9.7 mg/dL (ref 8.4–10.5)
Chloride: 101 mEq/L (ref 96–112)
Creatinine, Ser: 0.92 mg/dL (ref 0.40–1.20)
GFR: 64.49 mL/min (ref >60.00)
Glucose, Bld: 77 mg/dL (ref 70–99)
Potassium: 3.6 mEq/L (ref 3.5–5.1)
Sodium: 140 mEq/L (ref 135–145)
Total Bilirubin: 0.5 mg/dL (ref 0.2–1.2)
Total Protein: 7.5 g/dL (ref 6.0–8.3)

## 2021-03-14 LAB — LIPID PANEL
Cholesterol: 155 mg/dL (ref 0–200)
HDL: 56 mg/dL (ref >39.00)
LDL Cholesterol: 84 mg/dL (ref 0–99)
NonHDL: 98.57
Total CHOL/HDL Ratio: 3
Triglycerides: 72 mg/dL (ref 0.0–149.0)
VLDL: 14.4 mg/dL (ref 0.0–40.0)

## 2021-03-14 LAB — VITAMIN D 25 HYDROXY (VIT D DEFICIENCY, FRACTURES): VITD: 48.6 ng/mL (ref 30.00–100.00)

## 2021-03-14 LAB — TSH: TSH: 1.49 u[IU]/mL (ref 0.35–4.50)

## 2021-03-17 ENCOUNTER — Ambulatory Visit: Payer: PPO | Admitting: Internal Medicine

## 2021-03-27 ENCOUNTER — Telehealth: Payer: Self-pay | Admitting: Internal Medicine

## 2021-03-27 NOTE — Telephone Encounter (Signed)
Left message for patient to call back and schedule Medicare Annual Wellness Visit (AWV) in office.   If not able to come in office, please offer to do virtually or by telephone.   DUE FOR AWVI  Please schedule at anytime with Nurse Health Advisor.

## 2021-04-07 ENCOUNTER — Other Ambulatory Visit: Payer: Self-pay

## 2021-04-07 ENCOUNTER — Ambulatory Visit: Payer: PPO | Admitting: Dermatology

## 2021-04-07 DIAGNOSIS — L578 Other skin changes due to chronic exposure to nonionizing radiation: Secondary | ICD-10-CM

## 2021-04-07 DIAGNOSIS — Z1283 Encounter for screening for malignant neoplasm of skin: Secondary | ICD-10-CM | POA: Diagnosis not present

## 2021-04-07 DIAGNOSIS — D1800 Hemangioma unspecified site: Secondary | ICD-10-CM

## 2021-04-07 DIAGNOSIS — L814 Other melanin hyperpigmentation: Secondary | ICD-10-CM | POA: Diagnosis not present

## 2021-04-07 DIAGNOSIS — Z85828 Personal history of other malignant neoplasm of skin: Secondary | ICD-10-CM

## 2021-04-07 DIAGNOSIS — L821 Other seborrheic keratosis: Secondary | ICD-10-CM

## 2021-04-07 DIAGNOSIS — D229 Melanocytic nevi, unspecified: Secondary | ICD-10-CM

## 2021-04-07 NOTE — Progress Notes (Signed)
   Follow-Up Visit   Subjective  Mackenzie Dean is a 70 y.o. female who presents for the following: Annual Exam (Paitent here today for full body exam. She states she has a spot on left forearm she would like checked. ). Patient here for full body skin exam and skin cancer screening.  The following portions of the chart were reviewed this encounter and updated as appropriate:  Tobacco  Allergies  Meds  Problems  Med Hx  Surg Hx  Fam Hx      Objective  Well appearing patient in no apparent distress; mood and affect are within normal limits.  A full examination was performed including scalp, head, eyes, ears, nose, lips, neck, chest, axillae, abdomen, back, buttocks, bilateral upper extremities, bilateral lower extremities, hands, feet, fingers, toes, fingernails, and toenails. All findings within normal limits unless otherwise noted below.   Assessment & Plan  Skin cancer screening   Lentigines - Scattered tan macules - Due to sun exposure - Benign-appering, observe - Recommend daily broad spectrum sunscreen SPF 30+ to sun-exposed areas, reapply every 2 hours as needed. - Call for any changes  Seborrheic Keratoses - Stuck-on, waxy, tan-brown papules and/or plaques on left forearm   - Benign-appearing - Discussed benign etiology and prognosis. - Observe - Call for any changes  Melanocytic Nevi - Tan-brown and/or pink-flesh-colored symmetric macules and papules - Benign appearing on exam today - Observation - Call clinic for new or changing moles - Recommend daily use of broad spectrum spf 30+ sunscreen to sun-exposed areas.   Hemangiomas - Red papules - Discussed benign nature - Observe - Call for any changes  Actinic Damage - Chronic condition, secondary to cumulative UV/sun exposure - diffuse scaly erythematous macules with underlying dyspigmentation - Recommend daily broad spectrum sunscreen SPF 30+ to sun-exposed areas, reapply every 2 hours as needed.  -  Staying in the shade or wearing long sleeves, sun glasses (UVA+UVB protection) and wide brim hats (4-inch brim around the entire circumference of the hat) are also recommended for sun protection.  - Call for new or changing lesions.  History of Basal Cell Carcinoma of the Skin - No evidence of recurrence today on left lateral breast (2016) - Recommend regular full body skin exams - Recommend daily broad spectrum sunscreen SPF 30+ to sun-exposed areas, reapply every 2 hours as needed.  - Call if any new or changing lesions are noted between office visits  Skin cancer screening performed today.  Return in about 1 year (around 04/07/2022) for tbse.  IRuthell Rummage, CMA, am acting as scribe for Sarina Ser, MD.  Documentation: I have reviewed the above documentation for accuracy and completeness, and I agree with the above.  Sarina Ser, MD

## 2021-04-07 NOTE — Patient Instructions (Addendum)
Melanoma ABCDEs ° °Melanoma is the most dangerous type of skin cancer, and is the leading cause of death from skin disease.  You are more likely to develop melanoma if you: °Have light-colored skin, light-colored eyes, or red or blond hair °Spend a lot of time in the sun °Tan regularly, either outdoors or in a tanning bed °Have had blistering sunburns, especially during childhood °Have a close family member who has had a melanoma °Have atypical moles or large birthmarks ° °Early detection of melanoma is key since treatment is typically straightforward and cure rates are extremely high if we catch it early.  ° °The first sign of melanoma is often a change in a mole or a new dark spot.  The ABCDE system is a way of remembering the signs of melanoma. ° °A for asymmetry:  The two halves do not match. °B for border:  The edges of the growth are irregular. °C for color:  A mixture of colors are present instead of an even brown color. °D for diameter:  Melanomas are usually (but not always) greater than 6mm - the size of a pencil eraser. °E for evolution:  The spot keeps changing in size, shape, and color. ° °Please check your skin once per month between visits. You can use a small mirror in front and a large mirror behind you to keep an eye on the back side or your body.  ° °If you see any new or changing lesions before your next follow-up, please call to schedule a visit. ° °Please continue daily skin protection including broad spectrum sunscreen SPF 30+ to sun-exposed areas, reapplying every 2 hours as needed when you're outdoors.  ° °Staying in the shade or wearing long sleeves, sun glasses (UVA+UVB protection) and wide brim hats (4-inch brim around the entire circumference of the hat) are also recommended for sun protection.   ° ° °

## 2021-04-10 ENCOUNTER — Encounter: Payer: Self-pay | Admitting: Internal Medicine

## 2021-04-10 ENCOUNTER — Ambulatory Visit (INDEPENDENT_AMBULATORY_CARE_PROVIDER_SITE_OTHER): Payer: PPO | Admitting: Internal Medicine

## 2021-04-10 ENCOUNTER — Other Ambulatory Visit: Payer: Self-pay

## 2021-04-10 VITALS — BP 112/70 | HR 90 | Temp 96.0°F | Resp 14 | Ht 64.0 in | Wt 118.6 lb

## 2021-04-10 DIAGNOSIS — Z8616 Personal history of COVID-19: Secondary | ICD-10-CM | POA: Diagnosis not present

## 2021-04-10 DIAGNOSIS — Z853 Personal history of malignant neoplasm of breast: Secondary | ICD-10-CM | POA: Diagnosis not present

## 2021-04-10 DIAGNOSIS — M810 Age-related osteoporosis without current pathological fracture: Secondary | ICD-10-CM

## 2021-04-10 DIAGNOSIS — E782 Mixed hyperlipidemia: Secondary | ICD-10-CM

## 2021-04-10 MED ORDER — ROSUVASTATIN CALCIUM 10 MG PO TABS
10.0000 mg | ORAL_TABLET | ORAL | 0 refills | Status: DC
Start: 2021-04-10 — End: 2021-10-15
  Filled 2021-04-10: qty 45, 90d supply, fill #0
  Filled 2021-07-27: qty 45, 90d supply, fill #1

## 2021-04-10 NOTE — Assessment & Plan Note (Addendum)
Mild but managed with every other day crestor due to aortic atherosclerosis n oted on prior CT.  She is willing to increase dosing to daily as a trial  Lab Results  Component Value Date   CHOL 155 03/14/2021   HDL 56.00 03/14/2021   LDLCALC 84 03/14/2021   TRIG 72.0 03/14/2021   CHOLHDL 3 03/14/2021

## 2021-04-10 NOTE — Progress Notes (Signed)
Subjective:  Patient ID: Mackenzie Dean, female    DOB: 1951/02/19  Age: 70 y.o. MRN: [id number]  CC: The primary encounter diagnosis was History of breast cancer in female. Diagnoses of Mixed hyperlipidemia, Personal history of COVID-19, and Age-related osteoporosis without current pathological fracture were also pertinent to this visit.  HPI Mackenzie Dean presents for  Follow up on hyperlipidemia , osteoporosis    She and Mackenzie Dean both were infected with  COVID In January while on a tandem cycling cruise vacation in [place].  Started with PND   Symptoms were extremely mild and they only missed one day of cycling.  They were not delayed in returning home and both feel fine     Breast Cancer;  She is due for breast MRI   Osteoporosis:  No recent falls or fractures.  Tolerating medication (Evista).   Reviewed findings of prior CT scan today..  Patient is tolerating a low dose of Crestor every other day   Outpatient Medications Prior to Visit  Medication Sig Dispense Refill  . Calcium Carbonate (CALCIUM 500 PO) Take by mouth daily in the afternoon.    . cetirizine (ZYRTEC) 10 MG tablet Take 10 mg by mouth daily.    Marland Kitchen estradiol (ESTRACE VAGINAL) 0.1 MG/GM vaginal cream PLACE 1 APPLICATORFUL VAGINALLY 2 TIMES A WEEK. 127.5 g 0  . Multiple Vitamins-Minerals (MULTIVITAMIN PO) Take 1 capsule by mouth daily.    . propranolol (INDERAL) 20 MG tablet Take 1 tablet (20 mg total) by mouth 3 (three) times daily as needed. 90 tablet 3  . raloxifene (EVISTA) 60 MG tablet TAKE 1 TABLET BY MOUTH DAILY. 90 tablet 1  . rosuvastatin (CRESTOR) 5 MG tablet Take 1 tablet (5 mg total) by mouth daily. NO FURTHER REFILLS UNTIL SEEN IN CLINIC, PLEASE CALL OFFICE TO SCHEDULE AN APPOINTMENT 30 tablet 0   No facility-administered medications prior to visit.    Review of Systems;  Patient denies headache, fevers, malaise, unintentional weight loss, skin rash, eye pain, sinus congestion and sinus pain, sore throat,  dysphagia,  hemoptysis , cough, dyspnea, wheezing, chest pain, palpitations, orthopnea, edema, abdominal pain, nausea, melena, diarrhea, constipation, flank pain, dysuria, hematuria, urinary  Frequency, nocturia, numbness, tingling, seizures,  Focal weakness, Loss of consciousness,  Tremor, insomnia, depression, anxiety, and suicidal ideation.      Objective:  BP 112/70 (BP Location: Left Arm, Patient Position: Sitting, Cuff Size: Normal)   Pulse 90   Temp (!) 96 F (35.6 C) (Temporal)   Resp 14   Ht 5' 4"  (1.626 m)   Wt 118 lb 9.6 oz (53.8 kg)   SpO2 98%   BMI 20.36 kg/m   BP Readings from Last 3 Encounters:  04/10/21 112/70  09/16/20 122/70  02/01/19 110/72    Wt Readings from Last 3 Encounters:  04/10/21 118 lb 9.6 oz (53.8 kg)  09/16/20 119 lb 12.8 oz (54.3 kg)  02/01/19 121 lb 8 oz (55.1 kg)    General appearance: alert, cooperative and appears stated age Ears: normal TM's and external ear canals both ears Throat: lips, mucosa, and tongue normal; teeth and gums normal Neck: no adenopathy, no carotid bruit, supple, symmetrical, trachea midline and thyroid not enlarged, symmetric, no tenderness/mass/nodules Back: symmetric, no curvature. ROM normal. No CVA tenderness. Lungs: clear to auscultation bilaterally Heart: regular rate and rhythm, S1, S2 normal, no murmur, click, rub or gallop Abdomen: soft, non-tender; bowel sounds normal; no masses,  no organomegaly Pulses: 2+ and symmetric Skin: Skin color,  texture, turgor normal. No rashes or lesions Lymph nodes: Cervical, supraclavicular, and axillary nodes normal.  Lab Results  Component Value Date   HGBA1C 5.5 09/16/2020    Lab Results  Component Value Date   CREATININE 0.92 03/14/2021   CREATININE 0.77 09/16/2020   CREATININE 0.84 11/23/2018    Lab Results  Component Value Date   WBC 5.8 09/16/2020   HGB 14.0 09/16/2020   HCT 41.0 09/16/2020   PLT 205.0 09/16/2020   GLUCOSE 77 03/14/2021   CHOL 155  03/14/2021   TRIG 72.0 03/14/2021   HDL 56.00 03/14/2021   LDLCALC 84 03/14/2021   ALT 16 03/14/2021   AST 24 03/14/2021   NA 140 03/14/2021   K 3.6 03/14/2021   CL 101 03/14/2021   CREATININE 0.92 03/14/2021   BUN 16 03/14/2021   CO2 32 03/14/2021   TSH 1.49 03/14/2021   HGBA1C 5.5 09/16/2020    DG Bone Density  Result Date: 10/21/2020 EXAM: DUAL X-RAY ABSORPTIOMETRY (DXA) FOR BONE MINERAL DENSITY IMPRESSION: Your patient Mackenzie Dean completed a BMD test on 10/21/2020 using the [facility] (software version: 14.10) manufactured by [employer]. The following summarizes the results of our evaluation. Technologist: SCE PATIENT BIOGRAPHICAL: Name: Mackenzie Dean, Mackenzie Dean Patient ID: [id number] Birth Date: 1951-02-07 Height: 63.8 in. Gender: Female Exam Date: 10/21/2020 Weight: 120.1 lbs. Indications: Breast CA, Caucasian, Height Loss, High Risk Meds, History of Breast Cancer, Osteopenia, Postmenopausal, Previous Chemo and Radiation Fractures: Left wrist Treatments: ASPRIN 81 MG, Calcium, Estradiol, Evista, Multi-Vitamin with calcium, Vit D, zyrtec DENSITOMETRY RESULTS: Site      Region     Measured Date Measured Age WHO Classification Young Adult T-score BMD %Change vs. Previous Significant Change (*) AP Spine L1-L2 10/21/[age] 66.9 Osteoporosis -2.6 0.859 g/cm2 -8.5% Yes AP Spine L1-L2 08/15/2015 61.8 Osteopenia -1.9 0.939 g/cm2 - - DualFemur Neck Left 10/21/2020 66.9 Osteopenia -1.7 0.803 g/cm2 -5.1% - DualFemur Neck Left 08/15/2015 61.8 Osteopenia -1.4 0.846 g/cm2 - - DualFemur Total Mean 10/21/2020 66.9 Osteopenia -1.2 0.855 g/cm2 -3.2% Yes DualFemur Total Mean 08/15/2015 61.8 Normal -1.0 0.883 g/cm2 - - ASSESSMENT: The BMD measured at AP Spine L1-L2 is 0.859 g/cm2 with a T-score of -2.6. This patient is considered OSTEOPOROTIC according to [place]) criteria. The scan quality is good. Compared with prior study, there has been a significant decrease in the  spine. Compared with prior study, there has been a significant decrease in the total hip. L3 and L4 were excluded due to degenerative changes. World Pharmacologist [facility]) criteria for post-menopausal, Caucasian Women: Normal:                   T-score at or above -1 SD Osteopenia/low bone mass: T-score between -1 and -2.5 SD Osteoporosis:             T-score at or below -2.5 SD RECOMMENDATIONS: 1. All patients should optimize calcium and vitamin D intake. 2. Consider FDA-approved medical therapies in postmenopausal women and men aged 56 years and older, based on the following: a. A hip or vertebral(clinical or morphometric) fracture b. T-score < -2.5 at the femoral neck or spine after appropriate evaluation to exclude secondary causes c. Low bone mass (T-score between -1.0 and -2.5 at the femoral neck or spine) and a 10-year probability of a hip fracture > 3% or a 10-year probability of a major osteoporosis-related fracture > 20% based on the [place]-adapted WHO algorithm 3. Clinician judgment and/or patient preferences may  indicate treatment for people with 10-year fracture probabilities above or below these levels FOLLOW-UP: People with diagnosed cases of osteoporosis or at high risk for fracture should have regular bone mineral density tests. For patients eligible for Medicare, routine testing is allowed once every 2 years. The testing frequency can be increased to one year for patients who have rapidly progressing disease, those who are receiving or discontinuing medical therapy to restore bone mass, or have additional risk factors. I have reviewed this report, and agree with the above findings. Mark A. Thornton Papas, M.D. [facility] Electronically Signed   By: Lavonia Dana M.D.   On: 10/21/2020 11:25    Assessment & Plan:   Problem List Items Addressed This Visit      Unprioritized   History of breast cancer in female - Primary   Relevant Orders   MR BREAST BILATERAL W CONTRAST   Mixed  hyperlipidemia    Mild but managed with every other day crestor due to aortic atherosclerosis n oted on prior CT.  She is willing to increase dosing to daily as a trial  Lab Results  Component Value Date   CHOL 155 03/14/2021   HDL 56.00 03/14/2021   LDLCALC 84 03/14/2021   TRIG 72.0 03/14/2021   CHOLHDL 3 03/14/2021         Relevant Medications   rosuvastatin (CRESTOR) 10 MG tablet   Osteoporosis    Managed WITH  Evista , started in 2007 for history of BRCA.  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  Reviewed options of 1) continuing Evista or 2) alternative therapies including Prolia (which has been shown in multiple trials to reduce the risk of  Vertebral fracture), , Reclast and Forteo.  She has a history of pill esophagitis with alendronate remotely.        Other Visit Diagnoses    Personal history of COVID-19       Relevant Orders   SARS-CoV-2 Semi-Quantitative Total Antibody, Spike       I provided  30 minutes of  face-to-face time during this encounter reviewing patient's current bone density and lipid levels.,  providing counseling on the above mentioned problems , and coordination  of care . I have changed Lona Kettle. Boehle's rosuvastatin. I am also having her maintain her cetirizine, Multiple Vitamins-Minerals (MULTIVITAMIN PO), propranolol, estradiol, Calcium Carbonate (CALCIUM 500 PO), and raloxifene.  Meds ordered this encounter  Medications  . rosuvastatin (CRESTOR) 10 MG tablet    Sig: Take 1 tablet (10 mg total) by mouth every other day.    Dispense:  90 tablet    Refill:  0    Medications Discontinued During This Encounter  Medication Reason  . rosuvastatin (CRESTOR) 5 MG tablet     Follow-up: Return in about 6 months (around 10/11/2021).   Crecencio Mc, MD

## 2021-04-10 NOTE — Patient Instructions (Signed)
Choices for management of  Vertebral osteoporosis include   Prolia  Reclast Forteo  All are injectable  Denosumab injection What is this medicine? DENOSUMAB (den oh sue mab) slows bone breakdown. Prolia is used to treat osteoporosis in women after menopause and in men, and in people who are taking corticosteroids for 6 months or more. Delton See is used to treat a high calcium level due to cancer and to prevent bone fractures and other bone problems caused by multiple myeloma or cancer bone metastases. Delton See is also used to treat giant cell tumor of the bone. This medicine may be used for other purposes; ask your health care provider or pharmacist if you have questions. COMMON BRAND NAME(S): Prolia, XGEVA What should I tell my health care provider before I take this medicine? They need to know if you have any of these conditions:  dental disease  having surgery or tooth extraction  infection  kidney disease  low levels of calcium or Vitamin D in the blood  malnutrition  on hemodialysis  skin conditions or sensitivity  thyroid or parathyroid disease  an unusual reaction to denosumab, other medicines, foods, dyes, or preservatives  pregnant or trying to get pregnant  breast-feeding How should I use this medicine? This medicine is for injection under the skin. It is given by a health care professional in a hospital or clinic setting. A special MedGuide will be given to you before each treatment. Be sure to read this information carefully each time. For Prolia, talk to your pediatrician regarding the use of this medicine in children. Special care may be needed. For Delton See, talk to your pediatrician regarding the use of this medicine in children. While this drug may be prescribed for children as young as 13 years for selected conditions, precautions do apply. Overdosage: If you think you have taken too much of this medicine contact a poison control center or emergency room at  once. NOTE: This medicine is only for you. Do not share this medicine with others. What if I miss a dose? It is important not to miss your dose. Call your doctor or health care professional if you are unable to keep an appointment. What may interact with this medicine? Do not take this medicine with any of the following medications:  other medicines containing denosumab This medicine may also interact with the following medications:  medicines that lower your chance of fighting infection  steroid medicines like prednisone or cortisone This list may not describe all possible interactions. Give your health care provider a list of all the medicines, herbs, non-prescription drugs, or dietary supplements you use. Also tell them if you smoke, drink alcohol, or use illegal drugs. Some items may interact with your medicine. What should I watch for while using this medicine? Visit your doctor or health care professional for regular checks on your progress. Your doctor or health care professional may order blood tests and other tests to see how you are doing. Call your doctor or health care professional for advice if you get a fever, chills or sore throat, or other symptoms of a cold or flu. Do not treat yourself. This drug may decrease your body's ability to fight infection. Try to avoid being around people who are sick. You should make sure you get enough calcium and vitamin D while you are taking this medicine, unless your doctor tells you not to. Discuss the foods you eat and the vitamins you take with your health care professional. See your dentist regularly.  Brush and floss your teeth as directed. Before you have any dental work done, tell your dentist you are receiving this medicine. Do not become pregnant while taking this medicine or for 5 months after stopping it. Talk with your doctor or health care professional about your birth control options while taking this medicine. Women should inform  their doctor if they wish to become pregnant or think they might be pregnant. There is a potential for serious side effects to an unborn child. Talk to your health care professional or pharmacist for more information. What side effects may I notice from receiving this medicine? Side effects that you should report to your doctor or health care professional as soon as possible:  allergic reactions like skin rash, itching or hives, swelling of the face, lips, or tongue  bone pain  breathing problems  dizziness  jaw pain, especially after dental work  redness, blistering, peeling of the skin  signs and symptoms of infection like fever or chills; cough; sore throat; pain or trouble passing urine  signs of low calcium like fast heartbeat, muscle cramps or muscle pain; pain, tingling, numbness in the hands or feet; seizures  unusual bleeding or bruising  unusually weak or tired Side effects that usually do not require medical attention (report to your doctor or health care professional if they continue or are bothersome):  constipation  diarrhea  headache  joint pain  loss of appetite  muscle pain  runny nose  tiredness  upset stomach This list may not describe all possible side effects. Call your doctor for medical advice about side effects. You may report side effects to FDA at 1-800-FDA-1088. Where should I keep my medicine? This medicine is only given in a clinic, doctor's office, or other health care setting and will not be stored at home. NOTE: This sheet is a summary. It may not cover all possible information. If you have questions about this medicine, talk to your doctor, pharmacist, or health care provider.  2021 Elsevier/Gold Standard (2018-03-11 16:10:44)   Zoledronic Acid Injection (Paget's Disease, Osteoporosis) What is this medicine? ZOLEDRONIC ACID (ZOE le dron ik AS id) slows calcium loss from bones. It treats Paget's disease and osteoporosis. It may be  used in other people at risk for bone loss. This medicine may be used for other purposes; ask your health care provider or pharmacist if you have questions. COMMON BRAND NAME(S): Reclast, Zometa What should I tell my health care provider before I take this medicine? They need to know if you have any of these conditions:  bleeding disorder  cancer  dental disease  kidney disease  low levels of calcium in the blood  low red blood cell counts  lung or breathing disease (asthma)  receiving steroids like dexamethasone or prednisone  an unusual or allergic reaction to zoledronic acid, other medicines, foods, dyes, or preservatives  pregnant or trying to get pregnant  breast-feeding How should I use this medicine? This drug is injected into a vein. It is given by a health care provider in a hospital or clinic setting. A special MedGuide will be given to you before each treatment. Be sure to read this information carefully each time. Talk to your health care provider about the use of this drug in children. Special care may be needed. Overdosage: If you think you have taken too much of this medicine contact a poison control center or emergency room at once. NOTE: This medicine is only for you. Do not share this  medicine with others. What if I miss a dose? Keep appointments for follow-up doses. It is important not to miss your dose. Call your health care provider if you are unable to keep an appointment. What may interact with this medicine?  certain antibiotics given by injection  NSAIDs, medicines for pain and inflammation, like ibuprofen or naproxen  some diuretics like bumetanide, furosemide  teriparatide This list may not describe all possible interactions. Give your health care provider a list of all the medicines, herbs, non-prescription drugs, or dietary supplements you use. Also tell them if you smoke, drink alcohol, or use illegal drugs. Some items may interact with your  medicine. What should I watch for while using this medicine? Visit your health care provider for regular checks on your progress. It may be some time before you see the benefit from this drug. Some people who take this drug have severe bone, joint, or muscle pain. This drug may also increase your risk for jaw problems or a broken thigh bone. Tell your health care provider right away if you have severe pain in your jaw, bones, joints, or muscles. Tell you health care provider if you have any pain that does not go away or that gets worse. You should make sure you get enough calcium and vitamin D while you are taking this drug. Discuss the foods you eat and the vitamins you take with your health care provider. You may need blood work done while you are taking this drug. Tell your dentist and dental surgeon that you are taking this drug. You should not have major dental surgery while on this drug. See your dentist to have a dental exam and fix any dental problems before starting this drug. Take good care of your teeth while on this drug. Make sure you see your dentist for regular follow-up appointments. What side effects may I notice from receiving this medicine? Side effects that you should report to your doctor or health care provider as soon as possible:  allergic reactions (skin rash, itching or hives; swelling of the face, lips, or tongue)  bone pain  infection (fever, chills, cough, sore throat, pain or trouble passing urine)  jaw pain, especially after dental work  joint pain  kidney injury (trouble passing urine or change in the amount of urine)  low calcium levels (fast heartbeat; muscle cramps or pain; pain, tingling, or numbness in the hands or feet; seizures)  low red blood cell counts (trouble breathing; feeling faint; lightheaded, falls; unusually weak or tired)  muscle pain  palpitations  redness, blistering, peeling, or loosening of the skin, including inside the mouth Side  effects that usually do not require medical attention (report to your doctor or health care provider if they continue or are bothersome):  diarrhea  eye irritation, itching, or pain  fever  general ill feeling or flu-like symptoms  headache  increase in blood pressure  nausea  pain, redness, or irritation at site where injected  stomach pain  upset stomach This list may not describe all possible side effects. Call your doctor for medical advice about side effects. You may report side effects to FDA at 1-800-FDA-1088. Where should I keep my medicine? This drug is given in a hospital or clinic. It will not be stored at home. NOTE: This sheet is a summary. It may not cover all possible information. If you have questions about this medicine, talk to your doctor, pharmacist, or health care provider.  2021 Elsevier/Gold Standard (2019-08-21 11:46:18)  Teriparatide injection What is this medicine? TERIPARATIDE (terr ih PAR a tyd) increases bone mass and strength. It helps to make healthy bone and to slow bone loss in people with osteoporosis. This medicine helps prevent bone fractures. This medicine may be used for other purposes; ask your health care provider or pharmacist if you have questions. COMMON BRAND NAME(S): Forteo What should I tell my health care provider before I take this medicine? They need to know if you have any of these conditions:  bone disease other than osteoporosis  high levels of calcium in the blood  history of cancer in the bone  kidney stone  Paget's disease  parathyroid disease  receiving radiation therapy  an unusual or allergic reaction to teriparatide, other medicines, foods, dyes, or preservatives  pregnant or trying to get pregnant  breast-feeding How should I use this medicine? This medicine is for injection under the skin. You will be taught how to prepare and give this medicine. Use exactly as directed. Take your medicine at regular  intervals. Do not take your medicine more often than directed. This drug comes with INSTRUCTIONS FOR USE. Ask your pharmacist for directions on how to use this drug. Read the information carefully. Talk to your pharmacist or health care provider if you have questions. It is important that you put your used needles and pens in a special sharps container. Do not put them in a trash can. If you do not have a sharps container, call your pharmacist or health care provider to get one. A special MedGuide will be given to you by the pharmacist with each prescription and refill. Be sure to read this information carefully each time. Talk to your pediatrician regarding the use of this medicine in children. Special care may be needed. Overdosage: If you think you have taken too much of this medicine contact a poison control center or emergency room at once. NOTE: This medicine is only for you. Do not share this medicine with others. What if I miss a dose? If you miss a dose, take it as soon as you can. If it is almost time for your next dose, take only that dose. Do not take double or extra doses. What may interact with this medicine?  digoxin This list may not describe all possible interactions. Give your health care provider a list of all the medicines, herbs, non-prescription drugs, or dietary supplements you use. Also tell them if you smoke, drink alcohol, or use illegal drugs. Some items may interact with your medicine. What should I watch for while using this medicine? Visit your doctor or health care professional for regular checks on your progress. Your doctor may order blood tests and other tests to see how you are doing. You should make sure you get enough calcium and vitamin D while you are taking this medicine, unless your doctor tells you not to. Discuss the foods you eat and the vitamins you take with your health care professional. Dennis Bast may get drowsy or dizzy. Do not drive, use machinery, or do  anything that needs mental alertness until you know how this medicine affects you. Do not stand or sit up quickly, especially if you are an older patient. This reduces the risk of dizzy or fainting spells. Talk to your doctor about your risk of cancer. You may be more at risk for certain types of cancers if you take this medicine. What side effects may I notice from receiving this medicine? Side effects that you should report  to your doctor or health care professional as soon as possible:  allergic reactions like skin rash, itching or hives, swelling of the face, lips, or tongue  blood in the urine; pain in the lower back or side; pain when urinating  signs and symptoms of low blood pressure like dizziness; feeling faint or lightheaded, falls; unusually weak or tired  signs and symptoms of increased calcium like nausea; vomiting; constipation; low energy; or muscle weakness Side effects that usually do not require medical attention (report these to your doctor or health care professional if they continue or are bothersome):  headache  joint pain  nausea  pain, redness, irritation or swelling at the injection site  stomach upset This list may not describe all possible side effects. Call your doctor for medical advice about side effects. You may report side effects to FDA at 1-800-FDA-1088. Where should I keep my medicine? Keep out of the reach of children. Store the pens in a refrigerator between 2 and 8 degrees C (36 and 46 degrees F). Do not freeze. Use the pen quickly after taking out of the refrigerator and recap and return to refrigerator right after using. Protect from light. Throw away any unused medicine 28 days after the first injection from the pen. Throw away any unopened, unused medicine after the expiration date on the label. NOTE: This sheet is a summary. It may not cover all possible information. If you have questions about this medicine, talk to your doctor, pharmacist, or  health care provider.  2021 Elsevier/Gold Standard (2019-10-26 10:58:57)

## 2021-04-12 ENCOUNTER — Encounter: Payer: Self-pay | Admitting: Dermatology

## 2021-04-12 NOTE — Assessment & Plan Note (Addendum)
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  Reviewed options of 1) continuing Evista or 2) alternative therapies including Prolia (which has been shown in multiple trials to reduce the risk of  Vertebral fracture), , Reclast and Forteo.  She has a history of pill esophagitis with alendronate remotely.

## 2021-04-14 LAB — SARS-COV-2 SEMI-QUANTITATIVE TOTAL ANTIBODY, SPIKE: SARS COV2 AB, Total Spike Semi QN: >2500 U/mL — ABNORMAL HIGH (ref <0.8)

## 2021-05-02 ENCOUNTER — Ambulatory Visit: Payer: PPO | Attending: Internal Medicine

## 2021-05-02 ENCOUNTER — Other Ambulatory Visit: Payer: Self-pay

## 2021-05-02 DIAGNOSIS — Z23 Encounter for immunization: Secondary | ICD-10-CM

## 2021-05-02 MED ORDER — PFIZER-BIONT COVID-19 VAC-TRIS 30 MCG/0.3ML IM SUSP
INTRAMUSCULAR | 0 refills | Status: DC
  Filled 2021-05-02: qty 0.3, 1d supply, fill #0

## 2021-05-02 NOTE — Progress Notes (Signed)
   Covid-19 Vaccination Clinic  Name:  RASHEIDA BRODEN    MRN: [id number] DOB: September 02, 1951  05/02/2021  Ms. Demary was observed post Covid-19 immunization for 15 minutes without incident. She was provided with Vaccine Information Sheet and instruction to access the V-Safe system.   Ms. Goris was instructed to call 911 with any severe reactions post vaccine: Difficulty breathing  Swelling of face and throat  A fast heartbeat  A bad rash all over body  Dizziness and weakness   Immunizations Administered     Name Date Dose VIS Date Route   PFIZER Comrnaty(Gray TOP) Covid-19 Vaccine 05/02/2021  9:29 AM 0.3 mL 10/24/2020 Intramuscular   Manufacturer: [place]   Lot: [id number]   NDC: [address]-1      Lu Duffel, PharmD, MBA Clinical Acute Care Pharmacist

## 2021-05-07 ENCOUNTER — Telehealth: Payer: Self-pay | Admitting: Internal Medicine

## 2021-05-07 NOTE — Telephone Encounter (Signed)
Left message for patient to call back and schedule Medicare Annual Wellness Visit (AWV) in office.   If not able to come in office, please offer to do virtually or by telephone.   Due for AWVI  Please schedule at anytime with Nurse Health Advisor.   

## 2021-05-20 ENCOUNTER — Other Ambulatory Visit: Payer: Self-pay

## 2021-06-09 DIAGNOSIS — Z853 Personal history of malignant neoplasm of breast: Secondary | ICD-10-CM | POA: Diagnosis not present

## 2021-06-09 DIAGNOSIS — Z1239 Encounter for other screening for malignant neoplasm of breast: Secondary | ICD-10-CM | POA: Diagnosis not present

## 2021-06-16 ENCOUNTER — Telehealth: Payer: Self-pay | Admitting: Internal Medicine

## 2021-06-16 NOTE — Telephone Encounter (Signed)
Left message for patient to call back and schedule Medicare Annual Wellness Visit (AWV) in office.   If not able to come in office, please offer to do virtually or by telepho  Due for AWVI   Please schedule at anytime with Nurse Health Advisor.

## 2021-06-16 NOTE — Telephone Encounter (Signed)
Pt states that she already has one scheduled with [facility]. FYI

## 2021-06-23 ENCOUNTER — Other Ambulatory Visit: Payer: Self-pay

## 2021-06-23 MED ORDER — ALENDRONATE SODIUM 70 MG PO TABS
ORAL_TABLET | ORAL | 11 refills | Status: DC
  Filled 2021-06-23: qty 4, 28d supply, fill #0
  Filled 2021-07-27: qty 4, 28d supply, fill #1
  Filled 2021-09-01: qty 4, 28d supply, fill #2
  Filled 2021-10-15: qty 12, 84d supply, fill #3
  Filled 2022-01-27: qty 12, 84d supply, fill #4

## 2021-07-28 ENCOUNTER — Other Ambulatory Visit: Payer: Self-pay

## 2021-09-01 ENCOUNTER — Other Ambulatory Visit: Payer: Self-pay

## 2021-10-13 ENCOUNTER — Other Ambulatory Visit: Payer: Self-pay

## 2021-10-13 ENCOUNTER — Ambulatory Visit (INDEPENDENT_AMBULATORY_CARE_PROVIDER_SITE_OTHER): Payer: PPO | Admitting: Internal Medicine

## 2021-10-13 ENCOUNTER — Encounter: Payer: Self-pay | Admitting: Internal Medicine

## 2021-10-13 VITALS — BP 122/68 | HR 88 | Temp 95.4°F | Ht 64.0 in | Wt 124.0 lb

## 2021-10-13 DIAGNOSIS — E782 Mixed hyperlipidemia: Secondary | ICD-10-CM | POA: Diagnosis not present

## 2021-10-13 DIAGNOSIS — I700 Atherosclerosis of aorta: Secondary | ICD-10-CM | POA: Diagnosis not present

## 2021-10-13 DIAGNOSIS — R002 Palpitations: Secondary | ICD-10-CM | POA: Diagnosis not present

## 2021-10-13 DIAGNOSIS — Z1231 Encounter for screening mammogram for malignant neoplasm of breast: Secondary | ICD-10-CM | POA: Diagnosis not present

## 2021-10-13 DIAGNOSIS — Z853 Personal history of malignant neoplasm of breast: Secondary | ICD-10-CM | POA: Diagnosis not present

## 2021-10-13 DIAGNOSIS — I479 Paroxysmal tachycardia, unspecified: Secondary | ICD-10-CM

## 2021-10-13 DIAGNOSIS — C801 Malignant (primary) neoplasm, unspecified: Secondary | ICD-10-CM

## 2021-10-13 MED ORDER — INFLUENZA VAC A&B SA ADJ QUAD 0.5 ML IM PRSY
PREFILLED_SYRINGE | INTRAMUSCULAR | 0 refills | Status: DC
  Filled 2021-10-13: qty 0.5, 1d supply, fill #0

## 2021-10-13 NOTE — Patient Instructions (Addendum)
I do recommend seeing Dr Rockey Situ again and will make the referral  Please use the propranolol the next time you have a run    Your annual mammogram has been ordered.  You are encouraged (required) to call to make your appointment at [facility]   Return for labs after a 3-4 hour fast

## 2021-10-13 NOTE — Progress Notes (Addendum)
Patient ID: Mackenzie Dean, female    DOB: November 09, 1951  Age: 70 y.o. MRN: [id number]  The patient is here for follow up and management of other chronic and acute problems.  This visit occurred during the SARS-CoV-2 public health emergency.  Safety protocols were in place, including screening questions prior to the visit, additional usage of staff PPE, and extensive cleaning of exam room while observing appropriate contact time as indicated for disinfecting solutions.     The risk factors are reflected in the social history.  The roster of all physicians providing medical care to patient - is listed in the Snapshot section of the chart.  Activities of daily living:  The patient is 100% independent in all ADLs: dressing, toileting, feeding as well as independent mobility  Home safety : The patient has smoke detectors in the home. They wear seatbelts.  There are no unsecured firearms at home. There is no violence in the home.   There is no risks for hepatitis, STDs or HIV. There is no   history of blood transfusion. They have no travel history to infectious disease endemic areas of the world.  The patient has seen their dentist in the last six month. They have seen their eye doctor in the last year. TShe denies  hearing difficulty with regard to whispered voices and some television programs.  They have deferred audiologic testing in the last year.  They do not  have excessive sun exposure. Discussed the need for sun protection: hats, long sleeves and use of sunscreen if there is significant sun exposure.   Diet: the importance of a healthy diet is discussed. They do have a healthy diet.  The benefits of regular aerobic exercise were discussed. She walks 5  times per week ,  30 minutes.   Depression screen: there are no signs or vegative symptoms of depression- irritability, change in appetite, anhedonia, sadness/tearfullness.  Cognitive assessment: the patient manages all their financial and  personal affairs and is actively engaged. They could relate day,date,year and events; recalled 2/3 objects at 3 minutes; performed clock-face test normally.  The following portions of the patient's history were reviewed and updated as appropriate: allergies, current medications, past family history, past medical history,  past surgical history, past social history  and problem list.  Visual acuity was not assessed per patient preference since she has regular follow up with her ophthalmologist. Hearing and body mass index were assessed and reviewed.   During the course of the visit the patient was educated and counseled about appropriate screening and preventive services including : fall prevention , diabetes screening, nutrition counseling, colorectal cancer screening, and recommended immunizations.    CC: The primary encounter diagnosis was Encounter for screening mammogram for malignant neoplasm of breast. Diagnoses of Mixed hyperlipidemia, Palpitations, Cancer ([place]), History of breast cancer in female, Thoracic aortic atherosclerosis ([place]), and Paroxysmal tachycardia ([place]) were also pertinent to this visit.  1) Aortic arch atherosclerosis:  Reviewed findings of prior CT scan today..  Patient  was tolerant of every other day Crestor and in May was encouraged to increase dosing to daily but instead increased the dose to 10 mg every other day.  She is tolerating this regimen.  We reviewed  the role of statin therapy in stablizing placque and preventing events.   Her coronary calcium score was zero  2) PSVT:  has not used the propranolol since being  prescribed it in 2020.  Recently she has had  episodes of nocturnal heart racing  during the last 2 months,  but none in the last 3 weeks.  However the most recent episode was accompanied by bilateral jaw tightness. No events have occurred during exertion. Reviewed her FH which is inclusive of PAD (carotid disease)  but no early CAD .  Has not seen Dr. Rockey Situ  since the 202 evaluation .  At that time he  recommended a ZIO monitor  if the episodes recurred.  She attributes the episodes to stress, related to her responsibilities as [employer],  because she has difficulty drawing boundarie between her personal life and her professional life and has frequent insomnia.    3)  Osteoporosis:  Her BMD was noted to worsen while receiving treatment with raloxifene.  Repeat trial of alendronate was started in August by Dr Jefm Bryant.    History Vona has a past medical history of Allergy, Basal cell carcinoma (02/26/2015), Breast cancer ([place]) (2001), Chicken pox, Heart murmur, Hyperlipidemia, Personal history of chemotherapy (2001), and Personal history of radiation therapy (2001).   She has a past surgical history that includes Breast surgery (Left, 2002); Dilation and curettage of uterus; Breast lumpectomy (Left, 2001); Breast biopsy (Right, 2001); and Breast biopsy (Left, 2001).   Her family history includes Arrhythmia in her mother; Breast cancer in her cousin and cousin; Dementia in her mother; Heart disease in her father and mother; Heart failure in her paternal aunt; Mental retardation in her mother; Stroke in her father.She reports that she has never smoked. She has been exposed to tobacco smoke. She has never used smokeless tobacco. She reports current alcohol use of about 1.0 standard drink per week. She reports that she does not use drugs.  Outpatient Medications Prior to Visit  Medication Sig Dispense Refill   alendronate (FOSAMAX) 70 MG tablet Take 1 tablet (70 mg total) by mouth every 7 (seven) days Take with a full glass of water. Do not lie down for the next 30 min. 4 tablet 11   Calcium Carbonate (CALCIUM 500 PO) Take by mouth daily in the afternoon.     cetirizine (ZYRTEC) 10 MG tablet Take 10 mg by mouth daily.     estradiol (ESTRACE VAGINAL) 0.1 MG/GM vaginal cream PLACE 1 APPLICATORFUL VAGINALLY 2 TIMES A WEEK.  127.5 g 0   Multiple Vitamins-Minerals (MULTIVITAMIN PO) Take 1 capsule by mouth daily.     propranolol (INDERAL) 20 MG tablet Take 1 tablet (20 mg total) by mouth 3 (three) times daily as needed. 90 tablet 3   rosuvastatin (CRESTOR) 10 MG tablet Take 1 tablet (10 mg total) by mouth every other day. 90 tablet 0   COVID-19 mRNA Vac-TriS, Pfizer, (PFIZER-BIONT COVID-19 VAC-TRIS) SUSP injection Inject into the muscle. 0.3 mL 0   raloxifene (EVISTA) 60 MG tablet TAKE 1 TABLET BY MOUTH DAILY. (Patient not taking: Reported on 10/13/2021) 90 tablet 1   No facility-administered medications prior to visit.    Review of Systems  Patient denies headache, fevers, malaise, unintentional weight loss, skin rash, eye pain, sinus congestion and sinus pain, sore throat, dysphagia,  hemoptysis , cough, dyspnea, wheezing, chest pain,  orthopnea, edema, abdominal pain, nausea, melena, diarrhea, constipation, flank pain, dysuria, hematuria, urinary  Frequency, nocturia, numbness, tingling, seizures,  Focal weakness, Loss of consciousness,  Tremor,, depression, anxiety, and suicidal ideation.    Objective:  BP 122/68 (BP Location: Left Arm, Patient Position: Sitting, Cuff Size: Normal)   Pulse 88   Temp (!) 95.4 F (35.2 C) (Temporal)  Ht 5\' 4"  (1.626 m)   Wt 124 lb (56.2 kg)   SpO2 98%   BMI 21.28 kg/m   Physical Exam  General appearance: alert, cooperative and appears stated age Head: Normocephalic, without obvious abnormality, atraumatic Eyes: conjunctivae/corneas clear. PERRL, EOM's intact. Fundi benign. Ears: normal TM's and external ear canals both ears Nose: Nares normal. Septum midline. Mucosa normal. No drainage or sinus tenderness. Throat: lips, mucosa, and tongue normal; teeth and gums normal Neck: no adenopathy, no carotid bruit, no JVD, supple, symmetrical, trachea midline and thyroid not enlarged, symmetric, no tenderness/mass/nodules Lungs: clear to auscultation bilaterally Breasts: left  breast lumpectomy with lateral scarring , otherwise normal appearance, no masses or tenderness Heart: regular rate and rhythm, S1, S2 normal, no murmur, click, rub or gallop Abdomen: soft, non-tender; bowel sounds normal; no masses,  no organomegaly Extremities: extremities normal, atraumatic, no cyanosis or edema Pulses: 2+ and symmetric Skin: Skin color, texture, turgor normal. No rashes or lesions Neurologic: Alert and oriented X 3, normal strength and tone. Normal symmetric reflexes. Normal coordination and gait.     Assessment & Plan:   Problem List Items Addressed This Visit     History of breast cancer in female    Diagnosed in 2001,  Left breast , S/p lumpectomy, XRT, chemo and adjuvant therapy all done at [facility] with no recurrence.  Her last mammogram was normal June 2021;shehad a normal bilateral breast MRI June 09 2021 done at [facility].  She  will  continue alternating with MRI .  She is no longer taking Evista due to change in therapy for management of osteoporosis to alendronate.       Relevant Orders   MM 3D SCREEN BREAST BILATERAL   Palpitations    Workup was deferred in the past by cardiology ; however her most recent recurrence was accompanied by bilateral jaw tightness.  Referral to Dr Rockey Situ for evaluation .  Advised to use Inderal for next occurrence       Relevant Orders   EKG 12-Lead (Completed)   Magnesium   TSH   Comprehensive metabolic panel   CBC with Differential/Platelet   Ambulatory referral to Cardiology   Thoracic aortic atherosclerosis ([place])    Noted on CT chest / coronary CT;   calcium score was zero .Marland Kitchen  She has been tolerating  rosuvastatin 210 mg every other day since last visit  in May 2022       Paroxysmal tachycardia ([place])    Likely SVT.  Has not used inderal since prescription in 2020.  Reviewed caffeine and alcohol use.  Occurrences happening at rest only.  Referring back to Dr Rockey Situ for Holter monitor placement.  Advised to use inderal for next  occurrrence given development of jaw tightness with prolonged occurrence .  I have ordered and reviewed a 12 lead EKG and find that there are no acute changes and patient is in sinus rhythm.        Mixed hyperlipidemia    Taking 10 mg rosuvastatin every other day       Relevant Orders   Lipid panel   Cancer ([place])   Other Visit Diagnoses     Encounter for screening mammogram for malignant neoplasm of breast    -  Primary       I have discontinued Lona Kettle. [place]'s raloxifene and Pfizer-BioNT COVID-19 Vac-TriS. I am also having her maintain her cetirizine, Multiple Vitamins-Minerals (MULTIVITAMIN PO), propranolol, estradiol, Calcium Carbonate (CALCIUM 500 PO), rosuvastatin, and alendronate.  No  orders of the defined types were placed in this encounter.   Medications Discontinued During This Encounter  Medication Reason   raloxifene (EVISTA) 60 MG tablet    COVID-19 mRNA Vac-TriS, Pfizer, (PFIZER-BIONT COVID-19 VAC-TRIS) SUSP injection     Follow-up: No follow-ups on file.   Crecencio Mc, MD

## 2021-10-13 NOTE — Assessment & Plan Note (Signed)
Taking 10 mg rosuvastatin every other day

## 2021-10-14 ENCOUNTER — Encounter: Payer: Self-pay | Admitting: Internal Medicine

## 2021-10-14 NOTE — Assessment & Plan Note (Signed)
Workup was deferred in the past by cardiology ; however her most recent recurrence was accompanied by bilateral jaw tightness.  Referral to Dr Rockey Situ for evaluation .  Advised to use Inderal for next occurrence

## 2021-10-14 NOTE — Assessment & Plan Note (Addendum)
Likely SVT.  Has not used inderal since prescription in 2020.  Reviewed caffeine and alcohol use.  Occurrences happening at rest only.  Referring back to Dr Rockey Situ for Holter monitor placement.  Advised to use inderal for next occurrrence given development of jaw tightness with prolonged occurrence .  I have ordered and reviewed a 12 lead EKG and find that there are no acute changes and patient is in sinus rhythm.

## 2021-10-14 NOTE — Assessment & Plan Note (Addendum)
Diagnosed in 2001,  Left breast , S/p lumpectomy, XRT, chemo and adjuvant therapy all done at [facility] with no recurrence.  Her last mammogram was normal June 2021;shehad a normal bilateral breast MRI June 09 2021 done at [facility].  She  will  continue alternating with MRI .  She is no longer taking Evista due to change in therapy for management of osteoporosis to alendronate.

## 2021-10-14 NOTE — Assessment & Plan Note (Signed)
Noted on CT chest / coronary CT;   calcium score was zero .Marland Kitchen  She has been tolerating  rosuvastatin 210 mg every other day since last visit  in May 2022

## 2021-10-15 ENCOUNTER — Other Ambulatory Visit: Payer: Self-pay

## 2021-10-15 ENCOUNTER — Other Ambulatory Visit: Payer: Self-pay | Admitting: Internal Medicine

## 2021-10-15 MED ORDER — ROSUVASTATIN CALCIUM 10 MG PO TABS
10.0000 mg | ORAL_TABLET | ORAL | 3 refills | Status: DC
  Filled 2021-10-15: qty 45, 90d supply, fill #0
  Filled 2022-01-24: qty 45, 90d supply, fill #1
  Filled 2022-04-29: qty 45, 90d supply, fill #2
  Filled 2022-07-17: qty 45, 90d supply, fill #3

## 2021-10-16 ENCOUNTER — Other Ambulatory Visit: Payer: Self-pay

## 2021-10-23 ENCOUNTER — Ambulatory Visit: Payer: PPO | Attending: Internal Medicine

## 2021-10-23 ENCOUNTER — Other Ambulatory Visit: Payer: Self-pay

## 2021-10-23 ENCOUNTER — Other Ambulatory Visit: Payer: PPO

## 2021-10-23 DIAGNOSIS — Z23 Encounter for immunization: Secondary | ICD-10-CM

## 2021-10-23 MED ORDER — PFIZER COVID-19 VAC BIVALENT 30 MCG/0.3ML IM SUSP
INTRAMUSCULAR | 0 refills | Status: DC
  Filled 2021-10-23: qty 0.3, 1d supply, fill #0

## 2021-10-23 NOTE — Progress Notes (Signed)
   Covid-19 Vaccination Clinic  Name:  Mackenzie Dean    MRN: [id number] DOB: 03-10-1951  10/23/2021  Ms. [name] was observed post Covid-19 immunization for 15 minutes without incident. She was provided with Vaccine Information Sheet and instruction to access the V-Safe system.   Ms. [name] was instructed to call 911 with any severe reactions post vaccine: Difficulty breathing  Swelling of face and throat  A fast heartbeat  A bad rash all over body  Dizziness and weakness   Immunizations Administered     Name Date Dose VIS Date Route   Pfizer Covid-19 Vaccine Bivalent Booster 10/23/2021 12:53 PM 0.3 mL 07/16/2021 Intramuscular   Manufacturer: [place]   Lot: [id number]   [place]: [facility]  Name:  Mackenzie Dean    MRN: [id number] DOB: 15-Jan-1951  10/23/2021  Ms. [name] was observed post Covid-19 immunization for 15 minutes without incident. She was provided with Vaccine Information Sheet and instruction to access the V-Safe system.   Ms. [name] was instructed to call 911 with any severe reactions post vaccine: Difficulty breathing  Swelling of face and throat  A fast heartbeat  A bad rash all over body  Dizziness and weakness   Immunizations Administered     Name Date Dose VIS Date Route   Pfizer Covid-19 Vaccine Bivalent Booster 10/23/2021 12:53 PM 0.3 mL 07/16/2021 Intramuscular   Manufacturer: [place]   Lot: [id number]   [place]: [id number]

## 2021-11-04 ENCOUNTER — Other Ambulatory Visit (INDEPENDENT_AMBULATORY_CARE_PROVIDER_SITE_OTHER): Payer: PPO

## 2021-11-04 ENCOUNTER — Other Ambulatory Visit: Payer: Self-pay

## 2021-11-04 DIAGNOSIS — E782 Mixed hyperlipidemia: Secondary | ICD-10-CM | POA: Diagnosis not present

## 2021-11-04 DIAGNOSIS — R002 Palpitations: Secondary | ICD-10-CM | POA: Diagnosis not present

## 2021-11-04 LAB — CBC WITH DIFFERENTIAL/PLATELET
Basophils Absolute: 0.1 10*3/uL (ref 0.0–0.1)
Basophils Relative: 2.6 % (ref 0.0–3.0)
Eosinophils Absolute: 0.3 10*3/uL (ref 0.0–0.7)
Eosinophils Relative: 5.8 % — ABNORMAL HIGH (ref 0.0–5.0)
HCT: 45.5 % (ref 36.0–46.0)
Hemoglobin: 15.4 g/dL — ABNORMAL HIGH (ref 12.0–15.0)
Lymphocytes Relative: 32.1 % (ref 12.0–46.0)
Lymphs Abs: 1.5 10*3/uL (ref 0.7–4.0)
MCHC: 33.7 g/dL (ref 30.0–36.0)
MCV: 92.9 fl (ref 78.0–100.0)
Monocytes Absolute: 0.4 10*3/uL (ref 0.1–1.0)
Monocytes Relative: 8.2 % (ref 3.0–12.0)
Neutro Abs: 2.4 10*3/uL (ref 1.4–7.7)
Neutrophils Relative %: 51.3 % (ref 43.0–77.0)
Platelets: 214 10*3/uL (ref 150.0–400.0)
RBC: 4.9 Mil/uL (ref 3.87–5.11)
RDW: 12.9 % (ref 11.5–15.5)
WBC: 4.8 10*3/uL (ref 4.0–10.5)

## 2021-11-04 LAB — COMPREHENSIVE METABOLIC PANEL
ALT: 20 U/L (ref 0–35)
AST: 27 U/L (ref 0–37)
Albumin: 4.6 g/dL (ref 3.5–5.2)
Alkaline Phosphatase: 45 U/L (ref 39–117)
BUN: 15 mg/dL (ref 6–23)
CO2: 31 mEq/L (ref 19–32)
Calcium: 10 mg/dL (ref 8.4–10.5)
Chloride: 99 mEq/L (ref 96–112)
Creatinine, Ser: 0.84 mg/dL (ref 0.40–1.20)
GFR: 71.6 mL/min (ref >60.00)
Glucose, Bld: 74 mg/dL (ref 70–99)
Potassium: 4.1 mEq/L (ref 3.5–5.1)
Sodium: 138 mEq/L (ref 135–145)
Total Bilirubin: 0.9 mg/dL (ref 0.2–1.2)
Total Protein: 7.7 g/dL (ref 6.0–8.3)

## 2021-11-04 LAB — LIPID PANEL
Cholesterol: 178 mg/dL (ref 0–200)
HDL: 69 mg/dL (ref >39.00)
LDL Cholesterol: 94 mg/dL (ref 0–99)
NonHDL: 108.85
Total CHOL/HDL Ratio: 3
Triglycerides: 72 mg/dL (ref 0.0–149.0)
VLDL: 14.4 mg/dL (ref 0.0–40.0)

## 2021-11-04 LAB — TSH: TSH: 1.22 u[IU]/mL (ref 0.35–5.50)

## 2021-11-04 LAB — MAGNESIUM: Magnesium: 2.3 mg/dL (ref 1.5–2.5)

## 2021-11-23 NOTE — Progress Notes (Signed)
Cardiology Office Note  Date:  11/24/2021   ID:  Mackenzie Dean, DOB: [birth year], MRN: [id number]  PCP:  Mackenzie Mc, MD   Chief Complaint  Patient presents with   Palpitations    Patient c/o anxiety, chest tightness and palpitations. Medications reviewed by the patient verbally.     HPI:  Mackenzie Dean is a 71 y.o.  with a PMHx of: Thoracic aortic atherosclerosis  Osteopenia Palpitations Chest pain Breat cancer s/p radiation on left HLD CT calcium score 0 in 2015 She presents for paroxysmal tachycardia/chest pain.  LOV 01/2019  For the past 6 months has appreciated some chest tightness, tachycardia Heart racing, tightness as rest typically Though she has appreciated some tightness on exertion, climbing a hill Worse at altitude, [place] Prior trip to [place] experienced similar symptoms Otherwise reported good exercise tolerance, was able to ski well Just had symptoms climbing a hill Also felt somewhat overwhelmed with stress of the visit trying to handle meals, etc., grandchildren.  Lots on her plate  Typically very active at baseline, no anginal symptoms  extensive family history of Afib and HLD (mom, aunt and uncle).   CT coronary calcium scoring  1 region punctate/mild aortic atherosclerosis in the proximal descending aorta No significant coronary calcification noted  Total Chol 200/ LDL 124 CR 0.84 Glucose 77  EKG personally reviewed by myself on today's visit Shows NSR rhythm. 83 bpm. Possible left atrial enlargement  OTHER PAST MEDICAL HISTORY REVIEWED BY ME FOR TODAY'S VISIT: Prior history of breast cancer on the left received rounds of radiation approximately 2010  Last stress test was 5 years ago.   PMH:   has a past medical history of Allergy, Basal cell carcinoma (02/26/2015), Breast cancer ([place]) (2001), Chicken pox, Heart murmur, Hyperlipidemia, Personal history of chemotherapy (2001), and Personal history of radiation therapy (2001).  PSH:     Past Surgical History:  Procedure Laterality Date   BREAST BIOPSY Right 2001   negative   BREAST BIOPSY Left 2001   breast cancer invasive stage 1 ER +   BREAST LUMPECTOMY Left 2001   invasive mammary carcinoma   BREAST SURGERY Left 2002   DILATION AND CURETTAGE OF UTERUS      Current Outpatient Medications  Medication Sig Dispense Refill   alendronate (FOSAMAX) 70 MG tablet Take 1 tablet (70 mg total) by mouth every 7 (seven) days Take with a full glass of water. Do not lie down for the next 30 min. 4 tablet 11   Calcium Carbonate (CALCIUM 500 PO) Take by mouth daily in the afternoon.     cetirizine (ZYRTEC) 10 MG tablet Take 10 mg by mouth daily.     estradiol (ESTRACE VAGINAL) 0.1 MG/GM vaginal cream PLACE 1 APPLICATORFUL VAGINALLY 2 TIMES A WEEK. 127.5 g 0   metoprolol tartrate (LOPRESSOR) 100 MG tablet Take 1 tablet (100 mg total) by mouth once for 1 dose. Take 2 hours before your Cardiac CTA procedure 1 tablet 0   Multiple Vitamins-Minerals (MULTIVITAMIN PO) Take 1 capsule by mouth daily.     rosuvastatin (CRESTOR) 10 MG tablet Take 1 tablet (10 mg total) by mouth every other day. 90 tablet 3   COVID-19 mRNA bivalent vaccine, Pfizer, (PFIZER COVID-19 VAC BIVALENT) injection Inject into the muscle. (Patient not taking: Reported on 11/24/2021) 0.3 mL 0   influenza vaccine adjuvanted (FLUAD) 0.5 ML injection Inject into the muscle. (Patient not taking: Reported on 11/24/2021) 0.5 mL 0   propranolol (INDERAL) 20 MG tablet Take  1 tablet (20 mg total) by mouth 3 (three) times daily as needed. (Patient not taking: Reported on 11/24/2021) 90 tablet 3   No current facility-administered medications for this visit.    ALLERGIES:   Augmentin [amoxicillin-pot clavulanate]   SOCIAL HISTORY:  The patient  reports that she has never smoked. She has been exposed to tobacco smoke. She has never used smokeless tobacco. She reports current alcohol use of about 1.0 standard drink per week. She reports  that she does not use drugs.   FAMILY HISTORY:   family history includes Arrhythmia in her mother; Breast cancer in her cousin and cousin; Dementia in her mother; Heart disease in her father and mother; Heart failure in her paternal aunt; Mental retardation in her mother; Stroke in her father.    REVIEW OF SYSTEMS: Review of Systems  Constitutional: Negative.   Eyes: Negative.   Respiratory: Negative.    Cardiovascular:  Positive for chest pain (tightness) and palpitations.  Gastrointestinal: Negative.   Genitourinary: Negative.   Musculoskeletal: Negative.   Neurological: Negative.   Psychiatric/Behavioral: Negative.    All other systems reviewed and are negative.   PHYSICAL EXAM: VS:  BP 140/80 (BP Location: Left Arm, Patient Position: Sitting, Cuff Size: Normal)    Pulse 83    Ht 5\' 4"  (1.626 m)    Wt 124 lb (56.2 kg)    SpO2 99%    BMI 21.28 kg/m  , BMI Body mass index is 21.28 kg/m.  GEN: Well nourished, well developed, in no acute distress HEENT: normal Neck: no JVD, carotid bruits, or masses Cardiac: RRR; no murmurs, rubs, or gallops,no edema  Respiratory:  clear to auscultation bilaterally, normal work of breathing GI: soft, nontender, nondistended, + BS MS: no deformity or atrophy Skin: warm and dry, no rash Neuro:  Strength and sensation are intact Psych: euthymic mood, full affect  RECENT LABS: 11/04/2021: ALT 20; BUN 15; Creatinine, Ser 0.84; Hemoglobin 15.4; Magnesium 2.3; Platelets 214.0; Potassium 4.1; Sodium 138; TSH 1.22    LIPID PANEL: Lab Results  Component Value Date   CHOL 178 11/04/2021   HDL 69.00 11/04/2021   LDLCALC 94 11/04/2021   TRIG 72.0 11/04/2021      WEIGHT: Wt Readings from Last 3 Encounters:  11/24/21 124 lb (56.2 kg)  10/13/21 124 lb (56.2 kg)  04/10/21 118 lb 9.6 oz (53.8 kg)     ASSESSMENT AND PLAN:  Paroxysmal tachycardia (HCC) -  Etiology unclear, she does have a prior prescription for propranolol 20 mg to take as  needed for prolonged episodes ZIO monitor could be performed for more frequent episodes  Chest pain/angina Having pain over the past 6 months, worse at altitude recently in [place] climbing hills Concerning for angina After further discussion concerning various treatment options, we have ordered a cardiac CTA for further evaluation  Thoracic aortic atherosclerosis ([place]) Minimal plaque on prior CT scan  Chest pain, unspecified type -  Cardiac CTA ordered as above  Mixed hyperlipidemia history of radiation, elevated LDL in the 120s Crestor 5 daily    Orders Placed This Encounter  Procedures   CT CORONARY MORPH W/CTA COR W/SCORE W/CA W/CM &/OR WO/CM   EKG 12-Lead    Total encounter time more than 25 minutes  Greater than 50% was spent in counseling and coordination of care with the patient    Signed, Esmond Plants, M.D., Ph.D. 11/24/2021  [place], [place]

## 2021-11-24 ENCOUNTER — Encounter (HOSPITAL_COMMUNITY): Payer: Self-pay

## 2021-11-24 ENCOUNTER — Ambulatory Visit: Payer: PPO | Admitting: Cardiovascular Disease

## 2021-11-24 ENCOUNTER — Other Ambulatory Visit: Payer: Self-pay

## 2021-11-24 ENCOUNTER — Encounter: Payer: Self-pay | Admitting: Cardiovascular Disease

## 2021-11-24 VITALS — BP 140/80 | HR 83 | Ht 64.0 in | Wt 124.0 lb

## 2021-11-24 DIAGNOSIS — I700 Atherosclerosis of aorta: Secondary | ICD-10-CM | POA: Diagnosis not present

## 2021-11-24 DIAGNOSIS — E782 Mixed hyperlipidemia: Secondary | ICD-10-CM

## 2021-11-24 DIAGNOSIS — I208 Other forms of angina pectoris: Secondary | ICD-10-CM | POA: Diagnosis not present

## 2021-11-24 DIAGNOSIS — I479 Paroxysmal tachycardia, unspecified: Secondary | ICD-10-CM | POA: Diagnosis not present

## 2021-11-24 DIAGNOSIS — R079 Chest pain, unspecified: Secondary | ICD-10-CM | POA: Diagnosis not present

## 2021-11-24 MED ORDER — METOPROLOL TARTRATE 100 MG PO TABS
100.0000 mg | ORAL_TABLET | Freq: Once | ORAL | 0 refills | Status: DC
  Filled 2021-11-24: qty 1, 1d supply, fill #0

## 2021-11-24 NOTE — Patient Instructions (Addendum)
Medication Instructions:  No changes  If you need a refill on your cardiac medications before your next appointment, please call your pharmacy.   Lab work: No new labs needed  Testing/Procedures: Cardiac CTA  Follow-Up: At [facility], you and your health needs are our priority.  As part of our continuing mission to provide you with exceptional heart care, we have created designated Provider Care Teams.  These Care Teams include your primary Cardiologist (physician) and Advanced Practice Providers (APPs -  Physician Assistants and Nurse Practitioners) who all work together to provide you with the care you need, when you need it.  You will need a follow up appointment as needed  Providers on your designated Care Team:   Murray Hodgkins, NP Christell Faith, PA-C Cadence Kathlen Mody, [place]  COVID-19 Vaccine Information can be found at: [email or web address] For questions related to vaccine distribution or appointments, please email [email or web address] or call [phone].   Cardiac (coronary) CTA   [facility] [address] [place], [place] [address] [phone]  Date:   Time:      Someone from radiology will reach out to you to schedule   Please arrive 15 mins early for check-in and test prep.  Please follow these instructions carefully:  On the Night Before the Test: Be sure to Drink plenty of water. Do not consume any caffeinated/decaffeinated beverages or chocolate 12 hours prior to your test. This includes decaf as well Do not take any antihistamines 12 hours prior to your test. Such as Benadryl  On the Day of the Test: Drink plenty of water until 1 hour prior to the test. Do not eat any food 4 hours prior to the test. You may take your regular medications prior to the test.  Take metoprolol (Lopressor) two hours prior to test. Lopressor 100 mg This was sent in to your  pharmacy for pick-up HOLD Furosemide/Hydrochlorothiazide morning of the test. No fluid pills FEMALES- please wear underwire-free bra if available  After the Test: Drink plenty of water. After receiving IV contrast, you may experience a mild flushed feeling. This is normal. On occasion, you may experience a mild rash up to 24 hours after the test. This is not dangerous. If this occurs, you can take Benadryl 25 mg and increase your fluid intake (to flush out the kidneys) If you experience trouble breathing, this can be serious. If it is severe call 911 IMMEDIATELY. If it is mild, please call our office. If you take any of these medications:  Glipizide/Metformin Avandament,  Glucavance,  please do not take 48 hours after completing test unless otherwise instructed.   Once we have confirmed authorization from your insurance company, we will call you to set up a date and time for your test. Based on how quickly your insurance processes prior authorizations requests, please allow up to 4 weeks to be contacted for scheduling your Cardiac CT appointment. Be advised that routine Cardiac CT appointments could be scheduled as many as 8 weeks after your provider has ordered it.  For scheduling needs, including cancellations and rescheduling, please call [place], [phone].

## 2021-11-28 ENCOUNTER — Telehealth (HOSPITAL_COMMUNITY): Payer: Self-pay | Admitting: Emergency Medicine

## 2021-11-28 ENCOUNTER — Encounter (HOSPITAL_COMMUNITY): Payer: Self-pay | Admitting: Emergency Medicine

## 2021-11-28 NOTE — Telephone Encounter (Signed)
Reaching out to patient to offer assistance regarding upcoming cardiac imaging study; pt verbalizes understanding of appt date/time, parking situation and where to check in, pre-test NPO status and medications ordered, and verified current allergies; name and call back number provided for further questions should they arise Marchia Bond RN Navigator Cardiac Imaging [facility] [phone] office [phone] cell  100mg  metoprolol tart R arm only! Arrival 230p

## 2021-11-28 NOTE — Telephone Encounter (Signed)
Patient calling to check instructions for ct .   Reviewed with [facility] and patient and advised available on mychart after visit summary as well.

## 2021-12-01 ENCOUNTER — Ambulatory Visit
Admission: RE | Admit: 2021-12-01 | Discharge: 2021-12-01 | Disposition: A | Discharge status: Home / Self Care | Payer: PPO | Source: Ambulatory Visit | Attending: Cardiovascular Disease | Admitting: Cardiovascular Disease

## 2021-12-01 ENCOUNTER — Other Ambulatory Visit: Payer: Self-pay

## 2021-12-01 DIAGNOSIS — R079 Chest pain, unspecified: Secondary | ICD-10-CM | POA: Diagnosis not present

## 2021-12-01 DIAGNOSIS — I208 Other forms of angina pectoris: Secondary | ICD-10-CM | POA: Diagnosis not present

## 2021-12-01 MED ORDER — IOHEXOL 350 MG/ML SOLN
75.0000 mL | Freq: Once | INTRAVENOUS | Status: AC | PRN
  Administered 2021-12-01: 75 mL via INTRAVENOUS

## 2021-12-01 MED ORDER — NITROGLYCERIN 0.4 MG SL SUBL
0.8000 mg | SUBLINGUAL_TABLET | Freq: Once | SUBLINGUAL | Status: AC
  Administered 2021-12-01: 0.8 mg via SUBLINGUAL

## 2021-12-01 MED ORDER — METOPROLOL TARTRATE 5 MG/5ML IV SOLN
10.0000 mg | Freq: Once | INTRAVENOUS | Status: AC
  Administered 2021-12-01: 10 mg via INTRAVENOUS

## 2021-12-01 NOTE — Progress Notes (Signed)
Patient tolerated CT well. Drank water after. Vital signs stable encourage to drink water throughout day.Reasons explained and verbalized understanding. Ambulated steady gait.  

## 2021-12-11 ENCOUNTER — Telehealth: Payer: Self-pay

## 2021-12-11 NOTE — Telephone Encounter (Signed)
Left detail message on VM of Mrs. Kohan recent Cardiac CTA results, okay by DPR, Dr. Rockey Situ advised   "Cardiac CTA  Calcium score 0  No significant coronary disease  No other radiological findings noted "  At this time, no further recommendations or medications changes, advised to call office for any concerns or questions, otherwise will see at next visit.   Results released to MyChart by Dr. Rockey Situ Written by Minna Merritts, MD on 12/07/2021  9:18 PM EST Seen by patient Mackenzie Dean on 12/07/2021 10:16 PM

## 2022-01-26 ENCOUNTER — Other Ambulatory Visit: Payer: Self-pay

## 2022-01-27 ENCOUNTER — Other Ambulatory Visit: Payer: Self-pay

## 2022-03-31 ENCOUNTER — Encounter: Payer: Self-pay | Admitting: Internal Medicine

## 2022-03-31 ENCOUNTER — Other Ambulatory Visit: Payer: Self-pay

## 2022-03-31 ENCOUNTER — Ambulatory Visit (INDEPENDENT_AMBULATORY_CARE_PROVIDER_SITE_OTHER): Payer: PPO

## 2022-03-31 ENCOUNTER — Ambulatory Visit (INDEPENDENT_AMBULATORY_CARE_PROVIDER_SITE_OTHER): Payer: PPO | Admitting: Internal Medicine

## 2022-03-31 VITALS — BP 112/74 | HR 78 | Temp 98.2°F | Ht 64.0 in | Wt 123.0 lb

## 2022-03-31 DIAGNOSIS — Z853 Personal history of malignant neoplasm of breast: Secondary | ICD-10-CM

## 2022-03-31 DIAGNOSIS — F419 Anxiety disorder, unspecified: Secondary | ICD-10-CM | POA: Insufficient documentation

## 2022-03-31 DIAGNOSIS — M542 Cervicalgia: Secondary | ICD-10-CM | POA: Diagnosis not present

## 2022-03-31 DIAGNOSIS — M4312 Spondylolisthesis, cervical region: Secondary | ICD-10-CM | POA: Diagnosis not present

## 2022-03-31 MED ORDER — PREDNISONE 10 MG PO TABS
ORAL_TABLET | ORAL | 0 refills | Status: DC
  Filled 2022-03-31: qty 33, 8d supply, fill #0

## 2022-03-31 MED ORDER — TIZANIDINE HCL 4 MG PO TABS
4.0000 mg | ORAL_TABLET | Freq: Four times a day (QID) | ORAL | 0 refills | Status: DC | PRN
  Filled 2022-03-31: qty 30, 8d supply, fill #0

## 2022-03-31 NOTE — Patient Instructions (Addendum)
Please consider using the prednisone taper as a diagnostic tool.  If the pain resolves but returns,  an MRI may be worth getting in order to consider getting an ESI by Dr Loistine Chance ? ? ?I have also prescribed a muscle relaxer (tizanidine) to help relax your trapezius and SCM muscles ? ? ?

## 2022-03-31 NOTE — Progress Notes (Signed)
? ?Subjective:  ?Patient ID: Mackenzie Dean, female    DOB: [birth year]  Age: 71 y.o. MRN: [id number] ? ?CC: The primary encounter diagnosis was Cervicalgia of occipito-atlanto-axial region. Diagnoses of History of breast cancer and Anxiety were also pertinent to this visit. ? ? ?HPI ?Mackenzie Dean presents for  ?Chief Complaint  ?Patient presents with  ? Neck Pain  ?  Neck pain times 6 weeks  ? ?71 yr old female with history of breast cancer treated in 2002. Presents with neck stiffness and pain for the past 6 weeks.  Aggravated by golfing lessons,  prolonged sitting at the computer.  Getting messages and stretching help temporarily . The pain wakes her up at night.  Posterior . Hunches her shoulders a lot  . Does not radiate down either arm . Hears crepitus sounds with turning of head . Treated with advil daily for a few weeks , helped while taking it but no  lasting relief.  Tylenol helps at night.  ? ?No known history of whiplash injury has had neck pai nin the remote past that would last for weeks at at time.  ? ?Hands have been hurting lately diffuse, all joints.  but not in between .Marland Kitchen However,  she has been  doing a lot of pruning and gardening. She has been  evaluated by Dr. Jefm Bryant and treated for OA  ? ? ?Outpatient Medications Prior to Visit  ?Medication Sig Dispense Refill  ? Calcium Carbonate (CALCIUM 500 PO) Take by mouth daily in the afternoon.    ? cetirizine (ZYRTEC) 10 MG tablet Take 10 mg by mouth daily.    ? estradiol (ESTRACE VAGINAL) 0.1 MG/GM vaginal cream PLACE 1 APPLICATORFUL VAGINALLY 2 TIMES A WEEK. 127.5 g 0  ? Multiple Vitamins-Minerals (MULTIVITAMIN PO) Take 1 capsule by mouth daily.    ? rosuvastatin (CRESTOR) 10 MG tablet Take 1 tablet (10 mg total) by mouth every other day. 90 tablet 3  ? alendronate (FOSAMAX) 70 MG tablet Take 1 tablet (70 mg total) by mouth every 7 (seven) days Take with a full glass of water. Do not lie down for the next 30 min. (Patient not taking: Reported  on 03/31/2022) 4 tablet 11  ? propranolol (INDERAL) 20 MG tablet Take 1 tablet (20 mg total) by mouth 3 (three) times daily as needed. (Patient not taking: Reported on 11/24/2021) 90 tablet 3  ? COVID-19 mRNA bivalent vaccine, Pfizer, (PFIZER COVID-19 VAC BIVALENT) injection Inject into the muscle. (Patient not taking: Reported on 11/24/2021) 0.3 mL 0  ? influenza vaccine adjuvanted (FLUAD) 0.5 ML injection Inject into the muscle. (Patient not taking: Reported on 11/24/2021) 0.5 mL 0  ? metoprolol tartrate (LOPRESSOR) 100 MG tablet Take 1 tablet (100 mg total) by mouth once for 1 dose. Take 2 hours before your Cardiac CTA procedure 1 tablet 0  ? ?No facility-administered medications prior to visit.  ? ? ?Review of Systems; ? ?Patient denies headache, fevers, malaise, unintentional weight loss, skin rash, eye pain, sinus congestion and sinus pain, sore throat, dysphagia,  hemoptysis , cough, dyspnea, wheezing, chest pain, palpitations, orthopnea, edema, abdominal pain, nausea, melena, diarrhea, constipation, flank pain, dysuria, hematuria, urinary  Frequency, nocturia, numbness, tingling, seizures,  Focal weakness, Loss of consciousness,  Tremor, insomnia, depression, anxiety, and suicidal ideation.   ? ? ? ?Objective:  ?BP 112/74 (BP Location: Left Arm, Patient Position: Sitting, Cuff Size: Normal)   Pulse 78   Temp 98.2 ?F (36.8 ?C) (Oral)   Ht 5'  4" (1.626 m)   Wt 123 lb (55.8 kg)   SpO2 98%   BMI 21.11 kg/m?  ? ?BP Readings from Last 3 Encounters:  ?03/31/22 112/74  ?12/01/21 104/70  ?11/24/21 140/80  ? ? ?Wt Readings from Last 3 Encounters:  ?03/31/22 123 lb (55.8 kg)  ?11/24/21 124 lb (56.2 kg)  ?10/13/21 124 lb (56.2 kg)  ? ? ?General appearance: alert, cooperative and appears stated age ?Ears: normal TM's and external ear canals both ears ?Throat: lips, mucosa, and tongue normal; teeth and gums normal ?Neck: no adenopathy, no carotid bruit, supple, symmetrical, trachea midline and thyroid not enlarged,  symmetric, no spinal tenderness/mass/nodules ?Back: symmetric, no curvature. ROM normal. No CVA tenderness. ?Lungs: clear to auscultation bilaterally ?Heart: regular rate and rhythm, S1, S2 normal, no murmur, click, rub or gallop ?Abdomen: soft, non-tender; bowel sounds normal; no masses,  no organomegaly ?Pulses: 2+ and symmetric ?Skin: Skin color, texture, turgor normal. No rashes or lesions ?Ext:  no synovitis,  bilateral Heberden's nodes  ?Lymph nodes: Cervical, supraclavicular, and axillary nodes normal. ? ?Lab Results  ?Component Value Date  ? HGBA1C 5.5 09/16/2020  ? ? ?Lab Results  ?Component Value Date  ? CREATININE 0.84 11/04/2021  ? CREATININE 0.92 03/14/2021  ? CREATININE 0.77 09/16/2020  ? ? ?Lab Results  ?Component Value Date  ? WBC 4.8 11/04/2021  ? HGB 15.4 (H) 11/04/2021  ? HCT 45.5 11/04/2021  ? PLT 214.0 11/04/2021  ? GLUCOSE 74 11/04/2021  ? CHOL 178 11/04/2021  ? TRIG 72.0 11/04/2021  ? HDL 69.00 11/04/2021  ? [place] 94 11/04/2021  ? ALT 20 11/04/2021  ? AST 27 11/04/2021  ? NA 138 11/04/2021  ? K 4.1 11/04/2021  ? CL 99 11/04/2021  ? CREATININE 0.84 11/04/2021  ? BUN 15 11/04/2021  ? CO2 31 11/04/2021  ? TSH 1.22 11/04/2021  ? HGBA1C 5.5 09/16/2020  ? ? ?CT CORONARY MORPH W/CTA COR W/SCORE W/CA W/CM &/OR WO/CM ? ?Addendum Date: 12/01/2021   ?ADDENDUM REPORT: 12/01/2021 16:56 CLINICAL DATA:  Chest pain, shortness of breath EXAM: Cardiac/Coronary  CTA TECHNIQUE: The patient was scanned on a Siemens Somatom go.Top scanner. : A retrospective scan was triggered in the descending thoracic aorta. Axial non-contrast 3 mm slices were carried out through the heart. The data set was analyzed on a dedicated work station and scored using the [place]. Gantry rotation speed was 330 msecs and collimation was .6 mm. '100mg'$  of metoprolol and 0.8 mg of sl NTG was given. The 3D data set was reconstructed in 5% intervals of the 60-95 % of the R-R cycle. Diastolic phases were analyzed on a dedicated work  station using MPR, MIP and VRT modes. The patient received 75 cc of contrast. FINDINGS: Aorta: Normal size. Mild aortic root and descending aorta calcifications. No dissection. Aortic Valve:  Trileaflet.  No calcifications. Coronary Arteries:  Normal coronary origin.  Right dominance. RCA is a dominant artery that gives rise to PDA and PLA. There is no plaque. Left main gives rise to LAD and LCX arteries.  LM has no disease. LAD has no plaque. LCX is a non-dominant artery that gives rise to one OM1 branch. There is no plaque. Other findings: Normal pulmonary vein drainage into the left atrium. Normal left atrial appendage without a thrombus. Normal size of the pulmonary artery. IMPRESSION: 1. Normal coronary calcium score of 0. Patient is low risk for coronary events. 2. Normal coronary origin with right dominance. 3. No evidence of CAD. 4. CAD-RADS  0.  Consider non-atherosclerotic causes of chest pain. Electronically Signed   By: Kate Sable M.D.   On: 12/01/2021 16:56  ? ?Result Date: 12/01/2021 ?EXAM: OVER-READ INTERPRETATION  CT CHEST The following report is an over-read performed by radiologist Dr. Suzy Bouchard of [facility], [place] on 12/01/2021. This over-read does not include interpretation of cardiac or coronary anatomy or pathology. The coronary calcium score/coronary CTA interpretation by the cardiologist is attached. COMPARISON:  None. FINDINGS: Limited view of the lung parenchyma demonstrates no suspicious nodularity. Airways are normal. Limited view of the mediastinum demonstrates no adenopathy. Esophagus normal. Limited view of the upper abdomen unremarkable. Limited view of the skeleton and chest wall is unremarkable. IMPRESSION: No significant extracardiac findings. Electronically Signed: By: Suzy Bouchard M.D. On: 12/01/2021 16:45  ? ? ?Assessment & Plan:  ? ?Problem List Items Addressed This Visit   ? ? Anxiety  ?  She has been struggling to control her worry about her family  members , specifically her grandchildren , due to the spiritual decay of current culture.  counselling given.  She declines therapy at this time.  ? ?  ?  ? Cervicalgia of occipito-atlanto-axial region - Primary  ?  Her pa

## 2022-03-31 NOTE — Assessment & Plan Note (Signed)
She has been struggling to control her worry about her family members , specifically her grandchildren , due to the spiritual decay of current culture.  counselling given.  She declines therapy at this time.  ?

## 2022-03-31 NOTE — Assessment & Plan Note (Signed)
Her pain has been chronic and intermittent but constant for the last 6 weeks and only transiently relieved with massage .  No recent fall.  Overuse likely playing a role.  MR. Prednisone taper and plain films ordered  ?

## 2022-04-03 DIAGNOSIS — H25013 Cortical age-related cataract, bilateral: Secondary | ICD-10-CM | POA: Diagnosis not present

## 2022-04-03 DIAGNOSIS — H43812 Vitreous degeneration, left eye: Secondary | ICD-10-CM | POA: Diagnosis not present

## 2022-04-03 DIAGNOSIS — H353131 Nonexudative age-related macular degeneration, bilateral, early dry stage: Secondary | ICD-10-CM | POA: Diagnosis not present

## 2022-04-03 DIAGNOSIS — H5203 Hypermetropia, bilateral: Secondary | ICD-10-CM | POA: Diagnosis not present

## 2022-04-08 ENCOUNTER — Encounter: Payer: PPO | Admitting: Dermatology

## 2022-04-29 ENCOUNTER — Other Ambulatory Visit: Payer: Self-pay

## 2022-05-06 ENCOUNTER — Ambulatory Visit: Payer: PPO | Admitting: Internal Medicine

## 2022-05-25 ENCOUNTER — Encounter: Payer: Self-pay | Admitting: Internal Medicine

## 2022-05-25 ENCOUNTER — Other Ambulatory Visit: Payer: Self-pay

## 2022-05-25 ENCOUNTER — Ambulatory Visit (INDEPENDENT_AMBULATORY_CARE_PROVIDER_SITE_OTHER): Payer: PPO | Admitting: Internal Medicine

## 2022-05-25 VITALS — BP 110/62 | HR 71 | Temp 98.1°F | Ht 64.0 in | Wt 121.4 lb

## 2022-05-25 DIAGNOSIS — Z853 Personal history of malignant neoplasm of breast: Secondary | ICD-10-CM | POA: Diagnosis not present

## 2022-05-25 DIAGNOSIS — M542 Cervicalgia: Secondary | ICD-10-CM

## 2022-05-25 MED ORDER — TIZANIDINE HCL 4 MG PO TABS
4.0000 mg | ORAL_TABLET | Freq: Four times a day (QID) | ORAL | 2 refills | Status: DC | PRN
  Filled 2022-05-25: qty 90, 23d supply, fill #0

## 2022-05-25 NOTE — Assessment & Plan Note (Signed)
With bone spurring  At C6-7 and anterolisthesis  of C4 on C5 .  No radiculitis or myelopathy.  referring to PT [place] [place] in [place] per patient request.

## 2022-05-25 NOTE — Patient Instructions (Addendum)
For your pain control:  You can add up to 2000 mg of acetominophen (tylenol) every day safely  In divided doses (500 mg every 6 hours  Or 1000 mg every 12 hours.)   You can add 400 mg advil every 12 hours as well.      I have placed the referral to the physical therapist .  If you don't hear from anyone in a week ,  let me know

## 2022-05-25 NOTE — Progress Notes (Unsigned)
Subjective:  Patient ID: Mackenzie Dean, female    DOB: Apr 09, 1951  Age: 71 y.o. MRN: [id number]  CC: There were no encounter diagnoses.   HPI Mackenzie Dean presents for follow up on neck pain  Chief Complaint  Patient presents with   Follow-up    Neck stiffness   OA and DDD involving C4-C& by plain films  have improved transiently with prednisone.  Keeping her up at night .  Wants to have PT   by the PT in [place]  treating her husband,   Isac Caddy  [phone]   Currently able to find positions that are painless,    Using  tylenol and advil  not together      Outpatient Medications Prior to Visit  Medication Sig Dispense Refill   alendronate (FOSAMAX) 70 MG tablet Take 1 tablet (70 mg total) by mouth every 7 (seven) days Take with a full glass of water. Do not lie down for the next 30 min. 4 tablet 11   Calcium Carbonate (CALCIUM 500 PO) Take by mouth daily in the afternoon.     cetirizine (ZYRTEC) 10 MG tablet Take 10 mg by mouth daily.     estradiol (ESTRACE VAGINAL) 0.1 MG/GM vaginal cream PLACE 1 APPLICATORFUL VAGINALLY 2 TIMES A WEEK. 127.5 g 0   Multiple Vitamins-Minerals (MULTIVITAMIN PO) Take 1 capsule by mouth daily.     propranolol (INDERAL) 20 MG tablet Take 1 tablet (20 mg total) by mouth 3 (three) times daily as needed. 90 tablet 3   rosuvastatin (CRESTOR) 10 MG tablet Take 1 tablet (10 mg total) by mouth every other day. 90 tablet 3   tiZANidine (ZANAFLEX) 4 MG tablet Take 1 tablet (4 mg total) by mouth every 6 (six) hours as needed for muscle spasms. 30 tablet 0   predniSONE (DELTASONE) 10 MG tablet 6 tablets daily for 3 days, then reduce by 1 tablet daily until gone 33 tablet 0   No facility-administered medications prior to visit.    Review of Systems;  Patient denies headache, fevers, malaise, unintentional weight loss, skin rash, eye pain, sinus congestion and sinus pain, sore throat, dysphagia,  hemoptysis , cough, dyspnea, wheezing, chest pain,  palpitations, orthopnea, edema, abdominal pain, nausea, melena, diarrhea, constipation, flank pain, dysuria, hematuria, urinary  Frequency, nocturia, numbness, tingling, seizures,  Focal weakness, Loss of consciousness,  Tremor, insomnia, depression, anxiety, and suicidal ideation.      Objective:  BP 110/62 (BP Location: Right Arm, Patient Position: Sitting, Cuff Size: Normal)   Pulse 71   Temp 98.1 F (36.7 C) (Oral)   Ht '5\' 4"'$  (1.626 m)   Wt 121 lb 6.4 oz (55.1 kg)   BMI 20.84 kg/m   BP Readings from Last 3 Encounters:  05/25/22 110/62  03/31/22 112/74  12/01/21 104/70    Wt Readings from Last 3 Encounters:  05/25/22 121 lb 6.4 oz (55.1 kg)  03/31/22 123 lb (55.8 kg)  11/24/21 124 lb (56.2 kg)    General appearance: alert, cooperative and appears stated age Ears: normal TM's and external ear canals both ears Throat: lips, mucosa, and tongue normal; teeth and gums normal Neck: no adenopathy, no carotid bruit, supple, symmetrical, trachea midline and thyroid not enlarged, symmetric, no tenderness/mass/nodules Back: symmetric, no curvature. ROM normal. No CVA tenderness. Lungs: clear to auscultation bilaterally Heart: regular rate and rhythm, S1, S2 normal, no murmur, click, rub or gallop Abdomen: soft, non-tender; bowel sounds normal; no masses,  no organomegaly Pulses: 2+  and symmetric Skin: Skin color, texture, turgor normal. No rashes or lesions Lymph nodes: Cervical, supraclavicular, and axillary nodes normal.  Lab Results  Component Value Date   HGBA1C 5.5 09/16/2020    Lab Results  Component Value Date   CREATININE 0.84 11/04/2021   CREATININE 0.92 03/14/2021   CREATININE 0.77 09/16/2020    Lab Results  Component Value Date   WBC 4.8 11/04/2021   HGB 15.4 (H) 11/04/2021   HCT 45.5 11/04/2021   PLT 214.0 11/04/2021   GLUCOSE 74 11/04/2021   CHOL 178 11/04/2021   TRIG 72.0 11/04/2021   HDL 69.00 11/04/2021   LDLCALC 94 11/04/2021   ALT 20 11/04/2021    AST 27 11/04/2021   NA 138 11/04/2021   K 4.1 11/04/2021   CL 99 11/04/2021   CREATININE 0.84 11/04/2021   BUN 15 11/04/2021   CO2 31 11/04/2021   TSH 1.22 11/04/2021   HGBA1C 5.5 09/16/2020    CT CORONARY MORPH W/CTA COR W/SCORE W/CA W/CM &/OR WO/CM  Addendum Date: 12/01/2021   ADDENDUM REPORT: 12/01/2021 16:56 CLINICAL DATA:  Chest pain, shortness of breath EXAM: Cardiac/Coronary  CTA TECHNIQUE: The patient was scanned on a Siemens Somatom go.Top scanner. : A retrospective scan was triggered in the descending thoracic aorta. Axial non-contrast 3 mm slices were carried out through the heart. The data set was analyzed on a dedicated work station and scored using the [place]. Gantry rotation speed was 330 msecs and collimation was .6 mm. '100mg'$  of metoprolol and 0.8 mg of sl NTG was given. The 3D data set was reconstructed in 5% intervals of the 60-95 % of the R-R cycle. Diastolic phases were analyzed on a dedicated work station using MPR, MIP and VRT modes. The patient received 75 cc of contrast. FINDINGS: Aorta: Normal size. Mild aortic root and descending aorta calcifications. No dissection. Aortic Valve:  Trileaflet.  No calcifications. Coronary Arteries:  Normal coronary origin.  Right dominance. RCA is a dominant artery that gives rise to PDA and PLA. There is no plaque. Left main gives rise to LAD and LCX arteries.  LM has no disease. LAD has no plaque. LCX is a non-dominant artery that gives rise to one OM1 branch. There is no plaque. Other findings: Normal pulmonary vein drainage into the left atrium. Normal left atrial appendage without a thrombus. Normal size of the pulmonary artery. IMPRESSION: 1. Normal coronary calcium score of 0. Patient is low risk for coronary events. 2. Normal coronary origin with right dominance. 3. No evidence of CAD. 4. CAD-RADS 0.  Consider non-atherosclerotic causes of chest pain. Electronically Signed   By: Kate Sable M.D.   On: 12/01/2021 16:56    Result Date: 12/01/2021 EXAM: OVER-READ INTERPRETATION  CT CHEST The following report is an over-read performed by radiologist Dr. Suzy Bouchard of [facility], [place] on 12/01/2021. This over-read does not include interpretation of cardiac or coronary anatomy or pathology. The coronary calcium score/coronary CTA interpretation by the cardiologist is attached. COMPARISON:  None. FINDINGS: Limited view of the lung parenchyma demonstrates no suspicious nodularity. Airways are normal. Limited view of the mediastinum demonstrates no adenopathy. Esophagus normal. Limited view of the upper abdomen unremarkable. Limited view of the skeleton and chest wall is unremarkable. IMPRESSION: No significant extracardiac findings. Electronically Signed: By: Suzy Bouchard M.D. On: 12/01/2021 16:45    Assessment & Plan:   Problem List Items Addressed This Visit   None   I spent a total of   minutes  with this patient in a face to face visit on the date of this encounter reviewing the last office visit with me on        ,  most recent with patient's cardiologist in    ,  patient'ss diet and eating habits, home blood pressure readings ,  most recent imaging study ,   and post visit ordering of testing and therapeutics.    Follow-up: No follow-ups on file.   Crecencio Mc, MD

## 2022-05-26 NOTE — Assessment & Plan Note (Signed)
There has been no recurrence.  Continue annual screening with 3D mammography alternating with breast MRI, which was done at [facility] in July 2022

## 2022-05-28 ENCOUNTER — Ambulatory Visit: Payer: PPO | Admitting: Internal Medicine

## 2022-06-04 DIAGNOSIS — M542 Cervicalgia: Secondary | ICD-10-CM | POA: Diagnosis not present

## 2022-06-17 DIAGNOSIS — M542 Cervicalgia: Secondary | ICD-10-CM | POA: Diagnosis not present

## 2022-06-24 DIAGNOSIS — M542 Cervicalgia: Secondary | ICD-10-CM | POA: Diagnosis not present

## 2022-07-14 ENCOUNTER — Ambulatory Visit: Payer: PPO | Admitting: Dermatology

## 2022-07-14 ENCOUNTER — Other Ambulatory Visit: Payer: Self-pay

## 2022-07-14 DIAGNOSIS — L814 Other melanin hyperpigmentation: Secondary | ICD-10-CM

## 2022-07-14 DIAGNOSIS — D1800 Hemangioma unspecified site: Secondary | ICD-10-CM | POA: Diagnosis not present

## 2022-07-14 DIAGNOSIS — D229 Melanocytic nevi, unspecified: Secondary | ICD-10-CM | POA: Diagnosis not present

## 2022-07-14 DIAGNOSIS — Z853 Personal history of malignant neoplasm of breast: Secondary | ICD-10-CM

## 2022-07-14 DIAGNOSIS — L821 Other seborrheic keratosis: Secondary | ICD-10-CM

## 2022-07-14 DIAGNOSIS — Z85828 Personal history of other malignant neoplasm of skin: Secondary | ICD-10-CM

## 2022-07-14 DIAGNOSIS — L708 Other acne: Secondary | ICD-10-CM

## 2022-07-14 DIAGNOSIS — L578 Other skin changes due to chronic exposure to nonionizing radiation: Secondary | ICD-10-CM

## 2022-07-14 DIAGNOSIS — Z1283 Encounter for screening for malignant neoplasm of skin: Secondary | ICD-10-CM

## 2022-07-14 MED ORDER — TRETINOIN 0.025 % EX CREA
TOPICAL_CREAM | Freq: Every day | CUTANEOUS | 11 refills | Status: AC
  Filled 2022-07-14: qty 45, 30d supply, fill #0
  Filled 2022-07-15: qty 45, 90d supply, fill #0

## 2022-07-14 NOTE — Patient Instructions (Addendum)
Topical retinoid medications like tretinoin/Retin-A, adapalene/Differin, tazarotene/Fabior, and Epiduo/Epiduo Forte can cause dryness and irritation when first started. Only apply a pea-sized amount to the entire affected area. Avoid applying it around the eyes, edges of mouth and creases at the nose. If you experience irritation, use a good moisturizer first and/or apply the medicine less often. If you are doing well with the medicine, you can increase how often you use it until you are applying every night. Be careful with sun protection while using this medication as it can make you sensitive to the sun. This medicine should not be used by pregnant women.    Due to recent changes in healthcare laws, you may see results of your pathology and/or laboratory studies on MyChart before the doctors have had a chance to review them. We understand that in some cases there may be results that are confusing or concerning to you. Please understand that not all results are received at the same time and often the doctors may need to interpret multiple results in order to provide you with the best plan of care or course of treatment. Therefore, we ask that you please give us 2 business days to thoroughly review all your results before contacting the office for clarification. Should we see a critical lab result, you will be contacted sooner.   If You Need Anything After Your Visit  If you have any questions or concerns for your doctor, please call our main line at [phone] and press option 4 to reach your doctor's medical assistant. If no one answers, please leave a voicemail as directed and we will return your call as soon as possible. Messages left after 4 pm will be answered the following business day.   You may also send us a message via MyChart. We typically respond to MyChart messages within 1-2 business days.  For prescription refills, please ask your pharmacy to contact our office. Our fax number is  [phone].  If you have an urgent issue when the clinic is closed that cannot wait until the next business day, you can page your doctor at the number below.    Please note that while we do our best to be available for urgent issues outside of office hours, we are not available 24/7.   If you have an urgent issue and are unable to reach us, you may choose to seek medical care at your doctor's office, retail clinic, urgent care center, or emergency room.  If you have a medical emergency, please immediately call 911 or go to the emergency department.  Pager Numbers  - Dr. Kowalski: [phone]  - Dr. Moye: [phone]  - Dr. Stewart: [phone]  In the event of inclement weather, please call our main line at [phone] for an update on the status of any delays or closures.  Dermatology Medication Tips: Please keep the boxes that topical medications come in in order to help keep track of the instructions about where and how to use these. Pharmacies typically print the medication instructions only on the boxes and not directly on the medication tubes.   If your medication is too expensive, please contact our office at [phone] option 4 or send us a message through MyChart.   We are unable to tell what your co-pay for medications will be in advance as this is different depending on your insurance coverage. However, we may be able to find a substitute medication at lower cost or fill out paperwork to get insurance to cover a   needed medication.   If a prior authorization is required to get your medication covered by your insurance company, please allow us 1-2 business days to complete this process.  Drug prices often vary depending on where the prescription is filled and some pharmacies may offer cheaper prices.  The website www.goodrx.com contains coupons for medications through different pharmacies. The prices here do not account for what the cost may be with help from  insurance (it may be cheaper with your insurance), but the website can give you the price if you did not use any insurance.  - You can print the associated coupon and take it with your prescription to the pharmacy.  - You may also stop by our office during regular business hours and pick up a GoodRx coupon card.  - If you need your prescription sent electronically to a different pharmacy, notify our office through [facility] MyChart or by phone at [phone] option 4.     Si Usted Necesita Algo Despus de Su Visita  Tambin puede enviarnos un mensaje a travs de MyChart. Por lo general respondemos a los mensajes de MyChart en el transcurso de 1 a 2 das hbiles.  Para renovar recetas, por favor pida a su farmacia que se ponga en contacto con nuestra oficina. Nuestro nmero de fax es el [phone].  Si tiene un asunto urgente cuando la clnica est cerrada y que no puede esperar hasta el siguiente da hbil, puede llamar/localizar a su doctor(a) al nmero que aparece a continuacin.   Por favor, tenga en cuenta que aunque hacemos todo lo posible para estar disponibles para asuntos urgentes fuera del horario de oficina, no estamos disponibles las 24 horas del da, los 7 das de la semana.   Si tiene un problema urgente y no puede comunicarse con nosotros, puede optar por buscar atencin mdica  en el consultorio de su doctor(a), en una clnica privada, en un centro de atencin urgente o en una sala de emergencias.  Si tiene una emergencia mdica, por favor llame inmediatamente al 911 o vaya a la sala de emergencias.  Nmeros de bper  - Dr. Kowalski: [phone]  - Dra. Moye: [phone]  - Dra. Stewart: [phone]  En caso de inclemencias del tiempo, por favor llame a nuestra lnea principal al [phone] para una actualizacin sobre el estado de cualquier retraso o cierre.  Consejos para la medicacin en dermatologa: Por favor, guarde las cajas en las que vienen los  medicamentos de uso tpico para ayudarle a seguir las instrucciones sobre dnde y cmo usarlos. Las farmacias generalmente imprimen las instrucciones del medicamento slo en las cajas y no directamente en los tubos del medicamento.   Si su medicamento es muy caro, por favor, pngase en contacto con nuestra oficina llamando al [phone] y presione la opcin 4 o envenos un mensaje a travs de MyChart.   No podemos decirle cul ser su copago por los medicamentos por adelantado ya que esto es diferente dependiendo de la cobertura de su seguro. Sin embargo, es posible que podamos encontrar un medicamento sustituto a menor costo o llenar un formulario para que el seguro cubra el medicamento que se considera necesario.   Si se requiere una autorizacin previa para que su compaa de seguros cubra su medicamento, por favor permtanos de 1 a 2 das hbiles para completar este proceso.  Los precios de los medicamentos varan con frecuencia dependiendo del lugar de dnde se surte la receta y alguna farmacias pueden ofrecer precios ms baratos.    El sitio web www.goodrx.com tiene cupones para medicamentos de diferentes farmacias. Los precios aqu no tienen en cuenta lo que podra costar con la ayuda del seguro (puede ser ms barato con su seguro), pero el sitio web puede darle el precio si no utiliz ningn seguro.  - Puede imprimir el cupn correspondiente y llevarlo con su receta a la farmacia.  - Tambin puede pasar por nuestra oficina durante el horario de atencin regular y recoger una tarjeta de cupones de GoodRx.  - Si necesita que su receta se enve electrnicamente a una farmacia diferente, informe a nuestra oficina a travs de MyChart de  o por telfono llamando al [phone] y presione la opcin 4.  

## 2022-07-14 NOTE — Progress Notes (Unsigned)
Follow-Up Visit   Subjective  Mackenzie Dean is a 71 y.o. female who presents for the following: Annual Exam. Hx of BCC left lateral breast 2016. Hx of Breast cancer on the left breast.  The patient presents for Total-Body Skin Exam (TBSE) for skin cancer screening and mole check.  The patient has spots, moles and lesions to be evaluated, some may be new or changing and the patient has concerns that these could be cancer.   The following portions of the chart were reviewed this encounter and updated as appropriate:   Tobacco  Allergies  Meds  Problems  Med Hx  Surg Hx  Fam Hx     Review of Systems:  No other skin or systemic complaints except as noted in HPI or Assessment and Plan.  Objective  Well appearing patient in no apparent distress; mood and affect are within normal limits.  A full examination was performed including scalp, head, eyes, ears, nose, lips, neck, chest, axillae, abdomen, back, buttocks, bilateral upper extremities, bilateral lower extremities, hands, feet, fingers, toes, fingernails, and toenails. All findings within normal limits unless otherwise noted below.  face Smooth white papule(s).    Assessment & Plan  Hx of breast cancer Left Breast - No lymphadenopathy - Recommend regular full body skin exams - Recommend daily broad spectrum sunscreen SPF 30+ to sun-exposed areas, reapply every 2 hours as needed.  - Call if any new or changing lesions are noted between office visits  Other acne Face Chronic and persistent condition with duration or expected duration over one year. Condition is symptomatic / bothersome to patient. Not to goal. Acne/Milia  Start Tretinoin  0.25% cream apply at bedtime May consider Isotretinoin therapy in the future   Related Medications tretinoin (RETIN-A) 0.025 % cream Apply topically at bedtime.  Lentigines - Scattered tan macules - Due to sun exposure - Benign-appearing, observe - Recommend daily broad spectrum  sunscreen SPF 30+ to sun-exposed areas, reapply every 2 hours as needed. - Call for any changes  Seborrheic Keratoses - Stuck-on, waxy, tan-brown papules and/or plaques  - Benign-appearing - Discussed benign etiology and prognosis. - Observe - Call for any changes  Melanocytic Nevi - Tan-brown and/or pink-flesh-colored symmetric macules and papules - Benign appearing on exam today - Observation - Call clinic for new or changing moles - Recommend daily use of broad spectrum spf 30+ sunscreen to sun-exposed areas.   Hemangiomas - Red papules - Discussed benign nature - Observe - Call for any changes  Actinic Damage - Chronic condition, secondary to cumulative UV/sun exposure - diffuse scaly erythematous macules with underlying dyspigmentation - Recommend daily broad spectrum sunscreen SPF 30+ to sun-exposed areas, reapply every 2 hours as needed.  - Staying in the shade or wearing long sleeves, sun glasses (UVA+UVB protection) and wide brim hats (4-inch brim around the entire circumference of the hat) are also recommended for sun protection.  - Call for new or changing lesions.  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  - Call if any new or changing lesions are noted between office visits   Skin cancer screening performed today.   Return in about 1 year (around 07/15/2023) for TBSE, hx of BCC .  IMarye Round, CMA, am acting as scribe for Sarina Ser, MD .  Documentation: I have reviewed the above documentation for accuracy and completeness, and I agree with the above.  Sarina Ser, MD

## 2022-07-15 ENCOUNTER — Other Ambulatory Visit: Payer: Self-pay

## 2022-07-15 ENCOUNTER — Encounter: Payer: Self-pay | Admitting: Dermatology

## 2022-07-15 DIAGNOSIS — M542 Cervicalgia: Secondary | ICD-10-CM | POA: Diagnosis not present

## 2022-07-16 ENCOUNTER — Other Ambulatory Visit: Payer: Self-pay

## 2022-07-17 ENCOUNTER — Other Ambulatory Visit: Payer: Self-pay | Admitting: Internal Medicine

## 2022-07-17 ENCOUNTER — Other Ambulatory Visit: Payer: Self-pay

## 2022-07-17 MED ORDER — ALENDRONATE SODIUM 70 MG PO TABS
ORAL_TABLET | ORAL | 11 refills | Status: DC
  Filled 2022-07-17: qty 4, 28d supply, fill #0
  Filled 2022-08-27: qty 4, 28d supply, fill #1
  Filled 2022-09-30: qty 4, 28d supply, fill #2
  Filled 2022-10-26: qty 4, 28d supply, fill #3
  Filled 2022-12-03: qty 12, 84d supply, fill #4
  Filled 2023-03-03: qty 12, 84d supply, fill #5
  Filled 2023-05-12: qty 8, 56d supply, fill #6

## 2022-07-29 DIAGNOSIS — M542 Cervicalgia: Secondary | ICD-10-CM | POA: Diagnosis not present

## 2022-08-04 ENCOUNTER — Ambulatory Visit
Admission: RE | Admit: 2022-08-04 | Discharge: 2022-08-04 | Disposition: A | Discharge status: Home / Self Care | Payer: PPO | Source: Ambulatory Visit | Attending: Internal Medicine | Admitting: Internal Medicine

## 2022-08-04 ENCOUNTER — Encounter: Payer: Self-pay | Admitting: Radiology

## 2022-08-04 DIAGNOSIS — Z853 Personal history of malignant neoplasm of breast: Secondary | ICD-10-CM | POA: Insufficient documentation

## 2022-08-04 DIAGNOSIS — Z1231 Encounter for screening mammogram for malignant neoplasm of breast: Secondary | ICD-10-CM | POA: Diagnosis not present

## 2022-08-27 ENCOUNTER — Other Ambulatory Visit: Payer: Self-pay

## 2022-08-27 DIAGNOSIS — M542 Cervicalgia: Secondary | ICD-10-CM | POA: Diagnosis not present

## 2022-09-01 ENCOUNTER — Other Ambulatory Visit: Payer: Self-pay

## 2022-09-01 MED ORDER — FLUAD QUADRIVALENT 0.5 ML IM PRSY
PREFILLED_SYRINGE | INTRAMUSCULAR | 0 refills | Status: DC
  Filled 2022-09-01: qty 0.5, 1d supply, fill #0

## 2022-09-01 MED ORDER — COVID-19 MRNA VAC-TRIS(PFIZER) 30 MCG/0.3ML IM SUSY
PREFILLED_SYRINGE | INTRAMUSCULAR | 0 refills | Status: DC
  Filled 2022-09-01 – 2022-10-02 (×3): qty 0.3, 1d supply, fill #0

## 2022-09-17 DIAGNOSIS — M542 Cervicalgia: Secondary | ICD-10-CM | POA: Diagnosis not present

## 2022-09-30 ENCOUNTER — Other Ambulatory Visit: Payer: Self-pay

## 2022-10-02 ENCOUNTER — Other Ambulatory Visit: Payer: Self-pay

## 2022-10-06 DIAGNOSIS — M542 Cervicalgia: Secondary | ICD-10-CM | POA: Diagnosis not present

## 2022-10-14 ENCOUNTER — Telehealth: Payer: Self-pay

## 2022-10-14 ENCOUNTER — Encounter: Payer: Self-pay | Admitting: Internal Medicine

## 2022-10-14 ENCOUNTER — Telehealth: Payer: Self-pay | Admitting: Internal Medicine

## 2022-10-14 ENCOUNTER — Ambulatory Visit (INDEPENDENT_AMBULATORY_CARE_PROVIDER_SITE_OTHER): Payer: PPO | Admitting: Internal Medicine

## 2022-10-14 VITALS — BP 120/64 | HR 86 | Temp 97.9°F | Ht 63.5 in | Wt 118.6 lb

## 2022-10-14 DIAGNOSIS — E782 Mixed hyperlipidemia: Secondary | ICD-10-CM

## 2022-10-14 DIAGNOSIS — F419 Anxiety disorder, unspecified: Secondary | ICD-10-CM

## 2022-10-14 DIAGNOSIS — M810 Age-related osteoporosis without current pathological fracture: Secondary | ICD-10-CM

## 2022-10-14 DIAGNOSIS — R140 Abdominal distension (gaseous): Secondary | ICD-10-CM | POA: Diagnosis not present

## 2022-10-14 DIAGNOSIS — Z79899 Other long term (current) drug therapy: Secondary | ICD-10-CM | POA: Diagnosis not present

## 2022-10-14 DIAGNOSIS — R5383 Other fatigue: Secondary | ICD-10-CM

## 2022-10-14 DIAGNOSIS — Z1211 Encounter for screening for malignant neoplasm of colon: Secondary | ICD-10-CM | POA: Diagnosis not present

## 2022-10-14 DIAGNOSIS — R7301 Impaired fasting glucose: Secondary | ICD-10-CM | POA: Diagnosis not present

## 2022-10-14 DIAGNOSIS — I700 Atherosclerosis of aorta: Secondary | ICD-10-CM | POA: Diagnosis not present

## 2022-10-14 DIAGNOSIS — Z853 Personal history of malignant neoplasm of breast: Secondary | ICD-10-CM

## 2022-10-14 DIAGNOSIS — Z0000 Encounter for general adult medical examination without abnormal findings: Secondary | ICD-10-CM | POA: Diagnosis not present

## 2022-10-14 LAB — LIPID PANEL
Cholesterol: 173 mg/dL (ref 0–200)
HDL: 58.6 mg/dL (ref >39.00)
LDL Cholesterol: 84 mg/dL (ref 0–99)
NonHDL: 114.63
Total CHOL/HDL Ratio: 3
Triglycerides: 154 mg/dL — ABNORMAL HIGH (ref 0.0–149.0)
VLDL: 30.8 mg/dL (ref 0.0–40.0)

## 2022-10-14 LAB — CBC WITH DIFFERENTIAL/PLATELET
Basophils Absolute: 0.1 10*3/uL (ref 0.0–0.1)
Basophils Relative: 2.4 % (ref 0.0–3.0)
Eosinophils Absolute: 0.4 10*3/uL (ref 0.0–0.7)
Eosinophils Relative: 7.7 % — ABNORMAL HIGH (ref 0.0–5.0)
HCT: 43 % (ref 36.0–46.0)
Hemoglobin: 14.9 g/dL (ref 12.0–15.0)
Lymphocytes Relative: 31.3 % (ref 12.0–46.0)
Lymphs Abs: 1.5 10*3/uL (ref 0.7–4.0)
MCHC: 34.6 g/dL (ref 30.0–36.0)
MCV: 91.7 fl (ref 78.0–100.0)
Monocytes Absolute: 0.4 10*3/uL (ref 0.1–1.0)
Monocytes Relative: 7.3 % (ref 3.0–12.0)
Neutro Abs: 2.5 10*3/uL (ref 1.4–7.7)
Neutrophils Relative %: 51.3 % (ref 43.0–77.0)
Platelets: 230 10*3/uL (ref 150.0–400.0)
RBC: 4.69 Mil/uL (ref 3.87–5.11)
RDW: 12.4 % (ref 11.5–15.5)
WBC: 4.8 10*3/uL (ref 4.0–10.5)

## 2022-10-14 LAB — COMPREHENSIVE METABOLIC PANEL
ALT: 16 U/L (ref 0–35)
AST: 21 U/L (ref 0–37)
Albumin: 4.4 g/dL (ref 3.5–5.2)
Alkaline Phosphatase: 44 U/L (ref 39–117)
BUN: 22 mg/dL (ref 6–23)
CO2: 32 mEq/L (ref 19–32)
Calcium: 9.6 mg/dL (ref 8.4–10.5)
Chloride: 101 mEq/L (ref 96–112)
Creatinine, Ser: 1.04 mg/dL (ref 0.40–1.20)
GFR: 55.05 mL/min — ABNORMAL LOW (ref >60.00)
Glucose, Bld: 53 mg/dL — ABNORMAL LOW (ref 70–99)
Potassium: 4 mEq/L (ref 3.5–5.1)
Sodium: 141 mEq/L (ref 135–145)
Total Bilirubin: 0.5 mg/dL (ref 0.2–1.2)
Total Protein: 7.1 g/dL (ref 6.0–8.3)

## 2022-10-14 LAB — TSH: TSH: 1.01 u[IU]/mL (ref 0.35–5.50)

## 2022-10-14 LAB — HEMOGLOBIN A1C: Hgb A1c MFr Bld: 5.5 % (ref 4.6–6.5)

## 2022-10-14 LAB — LDL CHOLESTEROL, DIRECT: Direct LDL: 103 mg/dL

## 2022-10-14 NOTE — Progress Notes (Unsigned)
Patient ID: Mackenzie Dean, female    DOB: 11/19/1950  Age: 71 y.o. MRN: [id number]  The patient is here for annual preventive examination and management of other chronic and acute problems.   The risk factors are reflected in the social history.  The roster of all physicians providing medical care to patient - is listed in the Snapshot section of the chart.  Activities of daily living:  The patient is 100% independent in all ADLs: dressing, toileting, feeding as well as independent mobility  Home safety : The patient has smoke detectors in the home. They wear seatbelts.  There are no firearms at home. There is no violence in the home.   There is no risks for hepatitis, STDs or HIV. There is no   history of blood transfusion. They have no travel history to infectious disease endemic areas of the world.  The patient has seen their dentist in the last six month. They have seen their eye doctor in the last year. They admit to slight hearing difficulty with regard to whispered voices and some television programs.  They have deferred audiologic testing in the last year.  They do not  have excessive sun exposure. Discussed the need for sun protection: hats, long sleeves and use of sunscreen if there is significant sun exposure.   Diet: the importance of a healthy diet is discussed. They do have a healthy diet.  The benefits of regular aerobic exercise were discussed. She exercises 5 days  for 60  minutes.   Depression screen: there are no signs or vegative symptoms of depression- irritability, change in appetite, anhedonia, sadness/tearfullness.   Cognitive assessment: the patient manages all their financial and personal affairs and is actively engaged. They could relate day,date,year and events; recalled 2/3 objects at 3 minutes; performed clock-face test normally.  The following portions of the patient's history were reviewed and updated as appropriate: allergies, current medications, past  family history, past medical history,  past surgical history, past social history  and problem list.  Visual acuity was not assessed per patient preference since she has regular follow up with her ophthalmologist. Hearing and body mass index were assessed and reviewed.   During the course of the visit the patient was educated and counseled about appropriate screening and preventive services including : fall prevention , diabetes screening, nutrition counseling, colorectal cancer screening, and recommended immunizations.    CC: The primary encounter diagnosis was Screening for colon cancer. Diagnoses of Mixed hyperlipidemia, Impaired fasting glucose, Long-term use of high-risk medication, History of breast cancer, Other fatigue, Age-related osteoporosis without current pathological fracture, Bloating symptom, History of breast cancer in female, Thoracic aortic atherosclerosis (Mackenzie Dean), Visit for preventive health examination, and Anxiety were also pertinent to this visit.  1) neck pain improving with Mackenzie Dean, which is  ongoing.  No longer using Mackenzie Dean.  Sleeping position changed, now sleeping through the night 6/7 nights.  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  Daily yoga helps  2) hyperplastic polyps on 2018  scope but h/o adenomatous polyps  so 5 yr follow up was advised and Mackenzie Dean referral discussed.  Husband Mackenzie Dean is also due and getting scheduled   (they prefer to schedule together )  3) history of BRCA. Marland Kitchen  Discussed rationale of ongoing surveillance using MRI alternating with mammogram   4) abd/pelvic bloating . Bowels moving regularly. Remains concerned about ovarian CA  History Mackenzie Dean has a past medical history of Allergy, Basal cell carcinoma (02/26/2015), Breast cancer ([place]) (2001), Chicken pox, Heart murmur, Hyperlipidemia, Personal history of chemotherapy (2001), and Personal history of radiation therapy  (2001).   She has a past surgical history that includes Breast surgery (Left, 2002); Dilation and curettage of uterus; Breast lumpectomy (Left, 2001); Breast biopsy (Right, 2001); and Breast biopsy (Left, 2001).   Her family history includes Arrhythmia in her mother; Breast cancer in her cousin and cousin; Dementia in her mother; Heart disease in her father and mother; Heart failure in her paternal aunt; Mental retardation in her mother; Stroke in her father.She reports that she has never smoked. She has been exposed to tobacco smoke. She has never used smokeless tobacco. She reports current alcohol use of about 1.0 standard drink of alcohol per week. She reports that she does not use drugs.  Outpatient Medications Prior to Visit  Medication Sig Dispense Refill   alendronate (FOSAMAX) 70 MG tablet Take 1 tablet (70 mg total) by mouth every 7 (seven) days Take with a full glass of water. Do not lie down for the next 30 min. 4 tablet 11   Calcium Carbonate (CALCIUM 500 PO) Take by mouth daily in the afternoon.     cetirizine (ZYRTEC) 10 MG tablet Take 10 mg by mouth daily.     estradiol (ESTRACE VAGINAL) 0.1 MG/GM vaginal cream PLACE 1 APPLICATORFUL VAGINALLY 2 TIMES A WEEK. 127.5 g 0   Multiple Vitamins-Minerals (MULTIVITAMIN PO) Take 1 capsule by mouth daily.     propranolol (INDERAL) 20 MG tablet Take 1 tablet (20 mg total) by mouth 3 (three) times daily as needed. 90 tablet 3   rosuvastatin (CRESTOR) 10 MG tablet Take 1 tablet (10 mg total) by mouth every other day. 90 tablet 3   tiZANidine (ZANAFLEX) 4 MG tablet Take 1 tablet (4 mg total) by mouth every 6 (six) hours as needed for muscle spasms. 90 tablet 2   tretinoin (RETIN-A) 0.025 % cream Apply topically at bedtime. 45 g 11   COVID-19 mRNA vaccine 2023-2024 (COMIRNATY) syringe Inject into the muscle. 0.3 mL 0   influenza vaccine adjuvanted (FLUAD QUADRIVALENT) 0.5 ML injection Inject into the muscle. 0.5 mL 0   No facility-administered  medications prior to visit.    Review of Systems  Patient denies headache, fevers, malaise, unintentional weight loss, skin rash, eye pain, sinus congestion and sinus pain, sore throat, dysphagia,  hemoptysis , cough, dyspnea, wheezing, chest pain, palpitations, orthopnea, edema, abdominal pain, nausea, melena, diarrhea, constipation, flank pain, dysuria, hematuria, urinary  Frequency, nocturia, numbness, tingling, seizures,  Focal weakness, Loss of consciousness,  Tremor, insomnia, depression, anxiety, and suicidal ideation.     Objective:  BP 120/64   Pulse 86   Temp 97.9 F (36.6 C) (Oral)   Ht 5' 3.5" (1.613 m)   Wt 118 lb 9.6 oz (53.8 kg)   SpO2 99%   BMI 20.68 kg/m   Physical Exam  General appearance: alert, cooperative and appears stated age Head: Normocephalic, without obvious abnormality, atraumatic Eyes: conjunctivae/corneas clear. PERRL, EOM's intact. Fundi benign. Ears: normal TM's and external ear canals both ears Nose: Nares normal. Septum midline. Mucosa normal. No drainage or sinus tenderness. Throat: lips, mucosa, and tongue normal; teeth and gums normal Neck: no adenopathy, no carotid bruit, no JVD, supple, symmetrical, trachea midline and thyroid not enlarged, symmetric, no tenderness/mass/nodules Lungs: clear to auscultation bilaterally Breasts: normal appearance, no masses or tenderness Heart: regular rate and rhythm,  S1, S2 normal, no murmur, click, rub or gallop Abdomen: soft, non-tender; bowel sounds normal; no masses,  no organomegaly Extremities: extremities normal, atraumatic, no cyanosis or edema Pulses: 2+ and symmetric Skin: Skin color, texture, turgor normal. No rashes or lesions Neurologic: Alert and oriented X 3, normal strength and tone. Normal symmetric reflexes. Normal coordination and gait.     Assessment & Plan:   Problem List Items Addressed This Visit     Anxiety    Reviewed several concerns she had about risk of cancer. There is no  evidence suggesting that risk of cervical CA is increased in women who used tamoxifen,  she remains concerned about ovarian CA due to chronic pelvic bloating.  CA 125 and pelvic ultrasound have been ordered       History of breast cancer    Normal mammogram Sept 2023      Relevant Orders   MR BREAST BILATERAL W WO CONTRAST INC CAD   History of breast cancer in female    There has been no recurrence.  Continue annual screening alternating 3D mammographywith breast MRI, which was done at [facility] in July 2022       Mixed hyperlipidemia   Relevant Orders   Lipid Profile (Completed)   Direct LDL (Completed)   Osteoporosis    Managed initially WITH  [facility] , started in 2007,  chosen because of her history of BRCA.  However by  DEXA in  Dec 2021 she experienced significant loss of density in the spine (T score dropped from  -1.9 in 2016 to -2.6 ) but nowhere else..  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 Repeat DEXA has been ordered       Relevant Orders   DG Bone Density   Thoracic aortic atherosclerosis ([place])    Noted on CT chest / coronary CT;   calcium score was zero .Marland Kitchen  She has been tolerating  rosuvastatin 10 mg every other day since last visit  in May 2022       Visit for preventive health examination    age appropriate education and counseling updated, referrals for preventative services and immunizations addressed, dietary and smoking counseling addressed, most recent labs reviewed.  I have personally reviewed and have noted:   1) the patient's medical and social history 2) The Mackenzie Dean's use of alcohol, tobacco, and illicit drugs 3) The patient's current medications and supplements 4) Functional ability including ADL's, fall risk, home safety risk, hearing and visual impairment 5) Diet and physical activities 6) Evidence for depression or mood  disorder 7) The patient's height, weight, and BMI have been recorded in the chart   I have made referrals, and provided counseling and education based on review of the above       Other Visit Diagnoses     Screening for colon cancer    -  Primary   Relevant Orders   Ambulatory referral to Gastroenterology   Impaired fasting glucose       Relevant Orders   HgB A1c (Completed)   Comp Met (CMET) (Completed)   Long-term use of high-risk medication       Relevant Orders   TSH (Completed)   Other fatigue       Relevant Orders   CBC with Differential/Platelet (Completed)   Bloating symptom       Relevant Orders   CA  125 (Completed)   US Pelvic Complete With Transvaginal       I have discontinued Alexarae Oliva. [employer]'s COVID-19 mRNA vaccine 2023-2024 and Fluad Quadrivalent. I am also having her maintain her cetirizine, Multiple Vitamins-Minerals (MULTIVITAMIN PO), propranolol, estradiol, Calcium Carbonate (CALCIUM 500 PO), rosuvastatin, tiZANidine, tretinoin, and alendronate.    I provided 40 minutes of  face-to-face time during this encounter reviewing patient's current problems and past surgeries,  recent labs and imaging studies, providing counseling on the above mentioned problems , and coordination  of care .   Follow-up: No follow-ups on file.   Crecencio Mc, MD

## 2022-10-14 NOTE — Telephone Encounter (Signed)
Gastroenterology Pre-Procedure Review  Request Date: TBD (Pt plans to call back after we get her husband's referral to be scheduled together)  Requesting Physician: Dr. Allen Norris  PATIENT REVIEW QUESTIONS: The patient responded to the following health history questions as indicated:    1. Are you having any GI issues? no 2. Do you have a personal history of Polyps? yes (last colonoscopy with Dr. Vira Agar at [facility] 09/30/17) 3. Do you have a family history of Colon Cancer or Polyps?  Unsure but not that she is aware 4. Diabetes Mellitus? no 5. Joint replacements in the past 12 months?no 6. Major health problems in the past 3 months?no 7. Any artificial heart valves, MVP, or defibrillator?no    MEDICATIONS & ALLERGIES:    Patient reports the following regarding taking any anticoagulation/antiplatelet therapy:   Plavix, Coumadin, Eliquis, Xarelto, Lovenox, Pradaxa, Brilinta, or Effient? no Aspirin? no  Patient confirms/reports the following medications:  Current Outpatient Medications  Medication Sig Dispense Refill   alendronate (FOSAMAX) 70 MG tablet Take 1 tablet (70 mg total) by mouth every 7 (seven) days Take with a full glass of water. Do not lie down for the next 30 min. 4 tablet 11   Calcium Carbonate (CALCIUM 500 PO) Take by mouth daily in the afternoon.     cetirizine (ZYRTEC) 10 MG tablet Take 10 mg by mouth daily.     estradiol (ESTRACE VAGINAL) 0.1 MG/GM vaginal cream PLACE 1 APPLICATORFUL VAGINALLY 2 TIMES A WEEK. 127.5 g 0   Multiple Vitamins-Minerals (MULTIVITAMIN PO) Take 1 capsule by mouth daily.     propranolol (INDERAL) 20 MG tablet Take 1 tablet (20 mg total) by mouth 3 (three) times daily as needed. 90 tablet 3   rosuvastatin (CRESTOR) 10 MG tablet Take 1 tablet (10 mg total) by mouth every other day. 90 tablet 3   tiZANidine (ZANAFLEX) 4 MG tablet Take 1 tablet (4 mg total) by mouth every 6 (six) hours as needed for muscle spasms. 90 tablet 2   tretinoin (RETIN-A) 0.025  % cream Apply topically at bedtime. 45 g 11   No current facility-administered medications for this visit.    Patient confirms/reports the following allergies:  Allergies  Allergen Reactions   Augmentin [Amoxicillin-Pot Clavulanate] Diarrhea    No orders of the defined types were placed in this encounter.   AUTHORIZATION INFORMATION Primary Insurance: 1D#: Group #:  Secondary Insurance: 1D#: Group #:  SCHEDULE INFORMATION: Date:  Time: Location:

## 2022-10-14 NOTE — Telephone Encounter (Signed)
Lft pt vm to call ofc regarding sch at [facility]. thanks

## 2022-10-14 NOTE — Patient Instructions (Addendum)
Return for RN visit  after your trip,  for your pneumonia (prevnar 200 vaccine   Your bone density test  been ordered.  Please call  [place]  to make your appointment at [place].  LABS TODAY

## 2022-10-14 NOTE — Assessment & Plan Note (Signed)
Normal mammogram Sept 2023

## 2022-10-15 LAB — CA 125: CA 125: 10 U/mL (ref <35)

## 2022-10-15 NOTE — Assessment & Plan Note (Addendum)
Noted on CT chest / coronary CT;   calcium score was zero .Marland Kitchen  She has been tolerating  rosuvastatin 10 mg every other day since last visit  in May 2022

## 2022-10-15 NOTE — Assessment & Plan Note (Signed)
There has been no recurrence.  Continue annual screening alternating 3D mammographywith breast MRI, which was done at [facility] in July 2022

## 2022-10-15 NOTE — Assessment & Plan Note (Signed)
Reviewed several concerns she had about risk of cancer. There is no evidence suggesting that risk of cervical CA is increased in women who used tamoxifen,  she remains concerned about ovarian CA due to chronic pelvic bloating.  CA 125 and pelvic ultrasound have been ordered

## 2022-10-15 NOTE — Assessment & Plan Note (Signed)

## 2022-10-15 NOTE — Assessment & Plan Note (Addendum)
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  However by  DEXA in  Dec 2021 she experienced significant loss of density in the spine (T score dropped from  -1.9 in 2016 to -2.6 ) but nowhere else..  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 Repeat DEXA has been ordered

## 2022-10-22 ENCOUNTER — Other Ambulatory Visit: Payer: Self-pay

## 2022-10-22 MED ORDER — PNEUMOCOCCAL 20-VAL CONJ VACC 0.5 ML IM SUSY
PREFILLED_SYRINGE | INTRAMUSCULAR | 0 refills | Status: DC
  Filled 2022-10-22: qty 0.5, 30d supply, fill #0
  Filled 2022-10-28: qty 0.5, 1d supply, fill #0

## 2022-10-23 ENCOUNTER — Ambulatory Visit: Payer: PPO

## 2022-10-26 ENCOUNTER — Other Ambulatory Visit: Payer: Self-pay | Admitting: Internal Medicine

## 2022-10-26 ENCOUNTER — Other Ambulatory Visit: Payer: Self-pay

## 2022-10-26 NOTE — Telephone Encounter (Signed)
Refilled: 10/30/2021 Last OV: 10/14/2022 Next OV: not scheduled

## 2022-10-27 ENCOUNTER — Other Ambulatory Visit: Payer: Self-pay

## 2022-10-27 MED ORDER — ESTRADIOL 0.1 MG/GM VA CREA
TOPICAL_CREAM | VAGINAL | 3 refills | Status: AC
  Filled 2022-10-27: qty 85, 70d supply, fill #0

## 2022-10-28 ENCOUNTER — Other Ambulatory Visit: Payer: Self-pay

## 2022-11-02 ENCOUNTER — Other Ambulatory Visit: Payer: Self-pay

## 2022-11-02 MED ORDER — OSELTAMIVIR PHOSPHATE 75 MG PO CAPS
75.0000 mg | ORAL_CAPSULE | Freq: Every day | ORAL | 0 refills | Status: DC
  Filled 2022-11-02: qty 5, 5d supply, fill #0

## 2022-11-03 DIAGNOSIS — M542 Cervicalgia: Secondary | ICD-10-CM | POA: Diagnosis not present

## 2022-11-16 ENCOUNTER — Other Ambulatory Visit: Payer: Self-pay | Admitting: Internal Medicine

## 2022-11-17 ENCOUNTER — Other Ambulatory Visit: Payer: Self-pay

## 2022-11-17 MED ORDER — ROSUVASTATIN CALCIUM 10 MG PO TABS
10.0000 mg | ORAL_TABLET | ORAL | 3 refills | Status: DC
  Filled 2022-11-17 – 2022-12-01 (×2): qty 45, 90d supply, fill #0
  Filled 2023-03-03: qty 45, 90d supply, fill #1
  Filled 2023-06-27: qty 45, 90d supply, fill #2
  Filled 2023-09-28: qty 45, 90d supply, fill #3

## 2022-11-18 ENCOUNTER — Ambulatory Visit: Payer: PPO

## 2022-11-19 ENCOUNTER — Other Ambulatory Visit: Payer: Self-pay

## 2022-11-19 DIAGNOSIS — Z1211 Encounter for screening for malignant neoplasm of colon: Secondary | ICD-10-CM

## 2022-11-19 MED ORDER — NA SULFATE-K SULFATE-MG SULF 17.5-3.13-1.6 GM/177ML PO SOLN
354.0000 mL | Freq: Once | ORAL | 0 refills | Status: AC
  Filled 2022-11-19 – 2022-12-01 (×2): qty 354, 1d supply, fill #0

## 2022-11-25 DIAGNOSIS — M542 Cervicalgia: Secondary | ICD-10-CM | POA: Diagnosis not present

## 2022-11-27 ENCOUNTER — Other Ambulatory Visit: Payer: Self-pay

## 2022-12-01 ENCOUNTER — Other Ambulatory Visit: Payer: Self-pay

## 2022-12-03 ENCOUNTER — Ambulatory Visit
Admission: RE | Admit: 2022-12-03 | Discharge: 2022-12-03 | Disposition: A | Discharge status: Home / Self Care | Payer: PPO | Source: Ambulatory Visit | Attending: Internal Medicine | Admitting: Internal Medicine

## 2022-12-03 ENCOUNTER — Other Ambulatory Visit: Payer: Self-pay

## 2022-12-03 DIAGNOSIS — N939 Abnormal uterine and vaginal bleeding, unspecified: Secondary | ICD-10-CM | POA: Diagnosis not present

## 2022-12-03 DIAGNOSIS — R140 Abdominal distension (gaseous): Secondary | ICD-10-CM | POA: Diagnosis not present

## 2022-12-03 DIAGNOSIS — Z853 Personal history of malignant neoplasm of breast: Secondary | ICD-10-CM | POA: Diagnosis not present

## 2022-12-11 ENCOUNTER — Telehealth: Payer: Self-pay | Admitting: Gastroenterology

## 2022-12-11 NOTE — Telephone Encounter (Signed)
Called Endo and talk to [place] and she will get patient reschedule

## 2022-12-11 NOTE — Telephone Encounter (Signed)
Patient calling to cancel colonoscopy. Requests call back to reschedule.

## 2022-12-11 NOTE — Telephone Encounter (Signed)
Patient states her and her husband calendar did not line up and they had something to do on 12/17/2022. Reschedule to 01/14/2023 and sent out new instructions

## 2022-12-24 DIAGNOSIS — M542 Cervicalgia: Secondary | ICD-10-CM | POA: Diagnosis not present

## 2023-01-07 ENCOUNTER — Other Ambulatory Visit: Payer: Self-pay

## 2023-01-07 ENCOUNTER — Telehealth: Payer: PPO | Admitting: Physician Assistant

## 2023-01-07 DIAGNOSIS — U071 COVID-19: Secondary | ICD-10-CM

## 2023-01-07 MED ORDER — NIRMATRELVIR/RITONAVIR (PAXLOVID) TABLET (RENAL DOSING)
2.0000 | ORAL_TABLET | Freq: Two times a day (BID) | ORAL | 0 refills | Status: AC
  Filled 2023-01-07: qty 20, 5d supply, fill #0

## 2023-01-07 NOTE — Progress Notes (Signed)
Virtual Visit Consent   Mackenzie Dean, you are scheduled for a virtual visit with a [facility] provider today. Just as with appointments in the office, your consent must be obtained to participate. Your consent will be active for this visit and any virtual visit you may have with one of our providers in the next 365 days. If you have a MyChart account, a copy of this consent can be sent to you electronically.  As this is a virtual visit, video technology does not allow for your provider to perform a traditional examination. This may limit your provider's ability to fully assess your condition. If your provider identifies any concerns that need to be evaluated in person or the need to arrange testing (such as labs, EKG, etc.), we will make arrangements to do so. Although advances in technology are sophisticated, we cannot ensure that it will always work on either your end or our end. If the connection with a video visit is poor, the visit may have to be switched to a telephone visit. With either a video or telephone visit, we are not always able to ensure that we have a secure connection.  By engaging in this virtual visit, you consent to the provision of healthcare and authorize for your insurance to be billed (if applicable) for the services provided during this visit. Depending on your insurance coverage, you may receive a charge related to this service.  I need to obtain your verbal consent now. Are you willing to proceed with your visit today? Mackenzie Dean has provided verbal consent on 01/07/2023 for a virtual visit (video or telephone). Leeanne Rio, [place]  Date: 01/07/2023 4:17 PM  Virtual Visit via Video Note   I, Leeanne Rio, connected with  Mackenzie Dean  ([id number], 01-09-1951) on 01/07/23 at  4:15 PM EST by a video-enabled telemedicine application and verified that I am speaking with the correct person using two identifiers.  Location: Patient: Virtual Visit  Location Patient: Home Provider: Virtual Visit Location Provider: Home Office   I discussed the limitations of evaluation and management by telemedicine and the availability of in person appointments. The patient expressed understanding and agreed to proceed.    History of Present Illness: Mackenzie Dean is a 72 y.o. who identifies as a female who was assigned female at birth, and is being seen today for COVID-19. Husband recovering from [place] at present. Notes her symptoms starting Wednesday.  Took home COVID test which was positive. Symptoms include headache, nasal congestion, decreased sleep.  Denies chest pain or SOB, GI symptoms.   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   Anxiety 03/31/2022   Bilateral cataracts 09/17/2020   Osteoporosis 09/17/2020   Paroxysmal tachycardia ([place]) 02/01/2019   Mixed hyperlipidemia 02/01/2019   Thoracic aortic atherosclerosis ([place]) 06/25/2015   Visit for preventive health examination 06/12/2014   Palpitations 06/11/2014   Chest pain 06/11/2014   History of breast cancer in female 12/02/2013   Unspecified constipation 11/30/2013   History of breast cancer 2001    Allergies:  Allergies  Allergen Reactions   Augmentin [Amoxicillin-Pot Clavulanate] Diarrhea   Medications:  Current Outpatient Medications:    alendronate (FOSAMAX) 70 MG tablet, Take 1 tablet (70 mg total) by mouth every 7 (seven) days Take with a full glass of water. Do not lie down for the next 30 min., Disp: 4 tablet,  Rfl: 11   Calcium Carbonate (CALCIUM 500 PO), Take by mouth daily in the afternoon., Disp: , Rfl:    cetirizine (ZYRTEC) 10 MG tablet, Take 10 mg by mouth daily., Disp: , Rfl:    estradiol (ESTRACE VAGINAL) 0.1 MG/GM vaginal cream, PLACE 1 APPLICATORFUL VAGINALLY 2 TIMES A WEEK, Disp: 127.5 g, Rfl: 3   Multiple Vitamins-Minerals (MULTIVITAMIN PO), Take 1  capsule by mouth daily., Disp: , Rfl:    pneumococcal 20-valent conjugate vaccine (PREVNAR 20) 0.5 ML injection, Inject into the muscle., Disp: 0.5 mL, Rfl: 0   propranolol (INDERAL) 20 MG tablet, Take 1 tablet (20 mg total) by mouth 3 (three) times daily as needed., Disp: 90 tablet, Rfl: 3   rosuvastatin (CRESTOR) 10 MG tablet, Take 1 tablet (10 mg total) by mouth every other day., Disp: 90 tablet, Rfl: 3   tretinoin (RETIN-A) 0.025 % cream, Apply topically at bedtime., Disp: 45 g, Rfl: 11  Observations/Objective: Patient is well-developed, well-nourished in no acute distress.  Resting comfortably at home.  Head is normocephalic, atraumatic.  No labored breathing. Speech is clear and coherent with logical content.  Patient is alert and oriented at baseline.   Assessment and Plan: 1. COVID-19  Patient with multiple risk factors for complicated course of illness. Discussed risks/benefits of antiviral medications including most common potential ADRs. Patient voiced understanding and would like to proceed with antiviral medication. They are candidate for Paxlovid -- renal dosing based on last GFR 56. Rx sent to pharmacy. Supportive measures, OTC medications and vitamin regimen reviewed.  Quarantine reviewed in detail. Strict ER precautions discussed with patient.      Follow Up Instructions: I discussed the assessment and treatment plan with the patient. The patient was provided an opportunity to ask questions and all were answered. The patient agreed with the plan and demonstrated an understanding of the instructions.  A copy of instructions were sent to the patient via MyChart unless otherwise noted below.   The patient was advised to call back or seek an in-person evaluation if the symptoms worsen or if the condition fails to improve as anticipated.  Time:  I spent 10 minutes with the patient via telehealth technology discussing the above problems/concerns.    Leeanne Rio,  PA-C

## 2023-01-07 NOTE — Patient Instructions (Signed)
[name], thank you for joining Leeanne Rio, PA-C for today's virtual visit.  While this provider is not your primary care provider (PCP), if your PCP is located in our provider database this encounter information will be shared with them immediately following your visit.   [facility] account gives you access to today's visit and all your visits, tests, and labs performed at [facility] " click here if you don't have a [facility] account or go to [email or web address]  Consent: (Patient) Mackenzie Dean provided verbal consent for this virtual visit at the beginning of the encounter.  Current Medications:  Current Outpatient Medications:    alendronate (FOSAMAX) 70 MG tablet, Take 1 tablet (70 mg total) by mouth every 7 (seven) days Take with a full glass of water. Do not lie down for the next 30 min., Disp: 4 tablet, Rfl: 11   Calcium Carbonate (CALCIUM 500 PO), Take by mouth daily in the afternoon., Disp: , Rfl:    cetirizine (ZYRTEC) 10 MG tablet, Take 10 mg by mouth daily., Disp: , Rfl:    estradiol (ESTRACE VAGINAL) 0.1 MG/GM vaginal cream, PLACE 1 APPLICATORFUL VAGINALLY 2 TIMES A WEEK, Disp: 127.5 g, Rfl: 3   Multiple Vitamins-Minerals (MULTIVITAMIN PO), Take 1 capsule by mouth daily., Disp: , Rfl:    oseltamivir (TAMIFLU) 75 MG capsule, Take 1 capsule (75 mg total) by mouth daily., Disp: 5 capsule, Rfl: 0   pneumococcal 20-valent conjugate vaccine (PREVNAR 20) 0.5 ML injection, Inject into the muscle., Disp: 0.5 mL, Rfl: 0   propranolol (INDERAL) 20 MG tablet, Take 1 tablet (20 mg total) by mouth 3 (three) times daily as needed., Disp: 90 tablet, Rfl: 3   rosuvastatin (CRESTOR) 10 MG tablet, Take 1 tablet (10 mg total) by mouth every other day., Disp: 90 tablet, Rfl: 3   tiZANidine (ZANAFLEX) 4 MG tablet, Take 1 tablet (4 mg total) by mouth every 6 (six) hours as needed for muscle spasms., Disp: 90 tablet, Rfl: 2   tretinoin (RETIN-A)  0.025 % cream, Apply topically at bedtime., Disp: 45 g, Rfl: 11   Medications ordered in this encounter:  No orders of the defined types were placed in this encounter.    *If you need refills on other medications prior to your next appointment, please contact your pharmacy*  Follow-Up: Call back or seek an in-person evaluation if the symptoms worsen or if the condition fails to improve as anticipated.  [place] [phone]  Other Instructions Please keep well-hydrated and get plenty of rest. Start a saline nasal rinse to flush out your nasal passages. You can use plain Mucinex to help thin congestion. If you have a humidifier, running in the bedroom at night. I want you to start OTC vitamin D3 1000 units daily, vitamin C 1000 mg daily, and a zinc supplement. Please take prescribed medications as directed.  You were to quarantine for 5 days from onset of your symptoms.  After day 5, if you have had no fever and you are feeling better, you can end quarantine but need to mask for an additional 5 days. After day 5 if you have a fever or are having significant symptoms, please quarantine for full 10 days.  If you note any worsening of symptoms, any significant shortness of breath or any chest pain, please seek ER evaluation ASAP.  Please do not delay care!  COVID-19: What to Do if You Are Sick If you test positive and  are an older adult or someone who is at high risk of getting very sick from COVID-19, treatment may be available. Contact a healthcare provider right away after a positive test to determine if you are eligible, even if your symptoms are mild right now. You can also visit a Test to Treat location and, if eligible, receive a prescription from a provider. Don't delay: Treatment must be started within the first few days to be effective. If you have a fever, cough, or other symptoms, you might have COVID-19. Most people have mild illness and are able to recover at  home. If you are sick: Keep track of your symptoms. If you have an emergency warning sign (including trouble breathing), call 911. Steps to help prevent the spread of COVID-19 if you are sick If you are sick with COVID-19 or think you might have COVID-19, follow the steps below to care for yourself and to help protect other people in your home and community. Stay home except to get medical care Stay home. Most people with COVID-19 have mild illness and can recover at home without medical care. Do not leave your home, except to get medical care. Do not visit public areas and do not go to places where you are unable to wear a mask. Take care of yourself. Get rest and stay hydrated. Take over-the-counter medicines, such as acetaminophen, to help you feel better. Stay in touch with your doctor. Call before you get medical care. Be sure to get care if you have trouble breathing, or have any other emergency warning signs, or if you think it is an emergency. Avoid public transportation, ride-sharing, or taxis if possible. Get tested If you have symptoms of COVID-19, get tested. While waiting for test results, stay away from others, including staying apart from those living in your household. Get tested as soon as possible after your symptoms start. Treatments may be available for people with COVID-19 who are at risk for becoming very sick. Don't delay: Treatment must be started early to be effective--some treatments must begin within 5 days of your first symptoms. Contact your healthcare provider right away if your test result is positive to determine if you are eligible. Self-tests are one of several options for testing for the virus that causes COVID-19 and may be more convenient than laboratory-based tests and point-of-care tests. Ask your healthcare provider or your local health department if you need help interpreting your test results. You can visit your state, tribal, local, and territorial health  department's website to look for the latest local information on testing sites. Separate yourself from other people As much as possible, stay in a specific room and away from other people and pets in your home. If possible, you should use a separate bathroom. If you need to be around other people or animals in or outside of the home, wear a well-fitting mask. Tell your close contacts that they may have been exposed to COVID-19. An infected person can spread COVID-19 starting 48 hours (or 2 days) before the person has any symptoms or tests positive. By letting your close contacts know they may have been exposed to COVID-19, you are helping to protect everyone. See COVID-19 and Animals if you have questions about pets. If you are diagnosed with COVID-19, someone from the health department may call you. Answer the call to slow the spread. Monitor your symptoms Symptoms of COVID-19 include fever, cough, or other symptoms. Follow care instructions from your healthcare provider and local health department.  Your local health authorities may give instructions on checking your symptoms and reporting information. When to seek emergency medical attention Look for emergency warning signs* for COVID-19. If someone is showing any of these signs, seek emergency medical care immediately: Trouble breathing Persistent pain or pressure in the chest New confusion Inability to wake or stay awake Pale, gray, or blue-colored skin, lips, or nail beds, depending on skin tone *This list is not all possible symptoms. Please call your medical provider for any other symptoms that are severe or concerning to you. Call 911 or call ahead to your local emergency facility: Notify the operator that you are seeking care for someone who has or may have COVID-19. Call ahead before visiting your doctor Call ahead. Many medical visits for routine care are being postponed or done by phone or telemedicine. If you have a medical  appointment that cannot be postponed, call your doctor's office, and tell them you have or may have COVID-19. This will help the office protect themselves and other patients. If you are sick, wear a well-fitting mask You should wear a mask if you must be around other people or animals, including pets (even at home). Wear a mask with the best fit, protection, and comfort for you. You don't need to wear the mask if you are alone. If you can't put on a mask (because of trouble breathing, for example), cover your coughs and sneezes in some other way. Try to stay at least 6 feet away from other people. This will help protect the people around you. Masks should not be placed on young children under age 45 years, anyone who has trouble breathing, or anyone who is not able to remove the mask without help. Cover your coughs and sneezes Cover your mouth and nose with a tissue when you cough or sneeze. Throw away used tissues in a lined trash can. Immediately wash your hands with soap and water for at least 20 seconds. If soap and water are not available, clean your hands with an alcohol-based hand sanitizer that contains at least 60% alcohol. Clean your hands often Wash your hands often with soap and water for at least 20 seconds. This is especially important after blowing your nose, coughing, or sneezing; going to the bathroom; and before eating or preparing food. Use hand sanitizer if soap and water are not available. Use an alcohol-based hand sanitizer with at least 60% alcohol, covering all surfaces of your hands and rubbing them together until they feel dry. Soap and water are the best option, especially if hands are visibly dirty. Avoid touching your eyes, nose, and mouth with unwashed hands. Handwashing Tips Avoid sharing personal household items Do not share dishes, drinking glasses, cups, eating utensils, towels, or bedding with other people in your home. Wash these items thoroughly after using them  with soap and water or put in the dishwasher. Clean surfaces in your home regularly Clean and disinfect high-touch surfaces (for example, doorknobs, tables, handles, light switches, and countertops) in your "sick room" and bathroom. In shared spaces, you should clean and disinfect surfaces and items after each use by the person who is ill. If you are sick and cannot clean, a caregiver or other person should only clean and disinfect the area around you (such as your bedroom and bathroom) on an as needed basis. Your caregiver/other person should wait as long as possible (at least several hours) and wear a mask before entering, cleaning, and disinfecting shared spaces that you use. Clean  and disinfect areas that may have blood, stool, or body fluids on them. Use household cleaners and disinfectants. Clean visible dirty surfaces with household cleaners containing soap or detergent. Then, use a household disinfectant. Use a product from [employer] List N: Disinfectants for Coronavirus ([id number]). Be sure to follow the instructions on the label to ensure safe and effective use of the product. Many products recommend keeping the surface wet with a disinfectant for a certain period of time (look at "contact time" on the product label). You may also need to wear personal protective equipment, such as gloves, depending on the directions on the product label. Immediately after disinfecting, wash your hands with soap and water for 20 seconds. For completed guidance on cleaning and disinfecting your home, visit Complete Disinfection Guidance. Take steps to improve ventilation at home Improve ventilation (air flow) at home to help prevent from spreading COVID-19 to other people in your household. Clear out COVID-19 virus particles in the air by opening windows, using air filters, and turning on fans in your home. Use this interactive tool to learn how to improve air flow in your home. When you can be around others after  being sick with COVID-19 Deciding when you can be around others is different for different situations. Find out when you can safely end home isolation. For any additional questions about your care, contact your healthcare provider or state or local health department. 02/04/2021 Content source: [facility] ([facility]), Division of Viral Diseases This information is not intended to replace advice given to you by your health care provider. Make sure you discuss any questions you have with your health care provider. Document Revised: 03/20/2021 Document Reviewed: 03/20/2021 Elsevier Patient Education  2022 [employer].      If you have been instructed to have an in-person evaluation today at a local Urgent Care facility, please use the link below. It will take you to a list of all of our available [facility], including address, phone number and hours of operation. Please do not delay care.  [facility]  If you or a family member do not have a primary care provider, use the link below to schedule a visit and establish care. When you choose a [facility] primary care physician or advanced practice provider, you gain a long-term partner in health. Find a Primary Care Provider  Learn more about 's in-office and virtual care options: [facility]

## 2023-01-12 ENCOUNTER — Telehealth: Payer: Self-pay | Admitting: Gastroenterology

## 2023-01-12 NOTE — Telephone Encounter (Signed)
Patient is wondering if she needed to do clear liquids today like her husband that is starting clear liquids today for there colonoscopy on Thursday. Informed her no she only has to do a one day prep and he has to do a two day prep. She states that also he got a call that he had a copay and she does not. She states she has had history of colon polyps. Informed her it was put in for screening but I will fix the diagnosis code. Called ENDO and talk to [facility] and she changed diagnosis code.

## 2023-01-12 NOTE — Telephone Encounter (Signed)
Patient calling about colonoscopy instructions. Requesting call back.

## 2023-01-14 ENCOUNTER — Ambulatory Visit: Payer: PPO | Admitting: Anesthesiology

## 2023-01-14 ENCOUNTER — Encounter: Payer: Self-pay | Admitting: Gastroenterology

## 2023-01-14 ENCOUNTER — Encounter
Admission: RE | Disposition: A | Discharge status: Home / Self Care | Payer: Self-pay | Source: Home / Self Care | Attending: Gastroenterology

## 2023-01-14 ENCOUNTER — Ambulatory Visit
Admission: RE | Admit: 2023-01-14 | Discharge: 2023-01-14 | Disposition: A | Discharge status: Home / Self Care | Payer: PPO | Attending: Gastroenterology | Admitting: Gastroenterology

## 2023-01-14 DIAGNOSIS — D125 Benign neoplasm of sigmoid colon: Secondary | ICD-10-CM | POA: Insufficient documentation

## 2023-01-14 DIAGNOSIS — D120 Benign neoplasm of cecum: Secondary | ICD-10-CM | POA: Diagnosis not present

## 2023-01-14 DIAGNOSIS — D128 Benign neoplasm of rectum: Secondary | ICD-10-CM | POA: Diagnosis not present

## 2023-01-14 DIAGNOSIS — Z1211 Encounter for screening for malignant neoplasm of colon: Secondary | ICD-10-CM

## 2023-01-14 DIAGNOSIS — K5730 Diverticulosis of large intestine without perforation or abscess without bleeding: Secondary | ICD-10-CM | POA: Diagnosis not present

## 2023-01-14 DIAGNOSIS — K635 Polyp of colon: Secondary | ICD-10-CM

## 2023-01-14 DIAGNOSIS — Z86010 Personal history of colonic polyps: Secondary | ICD-10-CM | POA: Diagnosis not present

## 2023-01-14 DIAGNOSIS — E782 Mixed hyperlipidemia: Secondary | ICD-10-CM | POA: Insufficient documentation

## 2023-01-14 DIAGNOSIS — I700 Atherosclerosis of aorta: Secondary | ICD-10-CM | POA: Diagnosis not present

## 2023-01-14 DIAGNOSIS — D126 Benign neoplasm of colon, unspecified: Secondary | ICD-10-CM | POA: Diagnosis not present

## 2023-01-14 HISTORY — PX: COLONOSCOPY WITH PROPOFOL: SHX5780

## 2023-01-14 SURGERY — COLONOSCOPY WITH PROPOFOL
Anesthesia: General

## 2023-01-14 MED ORDER — PROPOFOL 500 MG/50ML IV EMUL
INTRAVENOUS | Status: DC | PRN
  Administered 2023-01-14: 125 ug/kg/min via INTRAVENOUS

## 2023-01-14 MED ORDER — PROPOFOL 1000 MG/100ML IV EMUL
INTRAVENOUS | Status: AC
  Filled 2023-01-14: qty 100

## 2023-01-14 MED ORDER — LIDOCAINE HCL (CARDIAC) PF 100 MG/5ML IV SOSY
PREFILLED_SYRINGE | INTRAVENOUS | Status: DC | PRN
  Administered 2023-01-14: 50 mg via INTRAVENOUS

## 2023-01-14 MED ORDER — PROPOFOL 10 MG/ML IV BOLUS
INTRAVENOUS | Status: DC | PRN
  Administered 2023-01-14: 70 mg via INTRAVENOUS

## 2023-01-14 MED ORDER — SODIUM CHLORIDE 0.9 % IV SOLN: INTRAVENOUS | Status: DC

## 2023-01-14 NOTE — Op Note (Signed)
[facility] Gastroenterology Patient Name: Mackenzie Dean Procedure Date: 01/14/2023 8:12 AM MRN: [id number] Account #: [id number] Date of Birth: 1951-04-12 Admit Type: Outpatient Age: 72 Room: [facility] ENDO ROOM 3 Gender: Female Note Status: Finalized Instrument Name: Peds Colonoscope [id number] Procedure:             Colonoscopy Indications:           Screening for colorectal malignant neoplasm, Last                         colonoscopy: August 2013 Providers:             Lin Landsman MD, MD Referring MD:          Deborra Medina, MD (Referring MD) Medicines:             General Anesthesia Complications:         No immediate complications. Estimated blood loss: None. Procedure:             Pre-Anesthesia Assessment:                        - Prior to the procedure, a History and Physical was                         performed, and patient medications and allergies were                         reviewed. The risks and benefits of the procedure and                         the sedation options and risks were discussed with the                         patient. All questions were answered and informed                         consent was obtained. Patient identification and                         proposed procedure were verified by the physician, the                         nurse, the anesthesiologist, the anesthetist and the                         technician in the pre-procedure area in the procedure                         room in the endoscopy suite. Mental Status                         Examination: alert and oriented. Airway Examination:                         normal oropharyngeal airway and neck mobility.                         Respiratory Examination: clear to auscultation. CV  Examination: normal. Prophylactic Antibiotics: The                         patient does not require prophylactic antibiotics.                         Prior  Anticoagulants: The patient has taken no                         anticoagulant or antiplatelet agents. ASA Grade                         Assessment: II - A patient with mild systemic disease.                         After reviewing the risks and benefits, the patient                         was deemed in satisfactory condition to undergo the                         procedure. The anesthesia plan was to use general                         anesthesia. Immediately prior to administration of                         medications, the patient was re-assessed for adequacy                         to receive sedatives. The heart rate, respiratory                         rate, oxygen saturations, blood pressure, adequacy of                         pulmonary ventilation, and response to care were                         monitored throughout the procedure. The physical                         status of the patient was re-assessed after the                         procedure.                        After obtaining informed consent, the colonoscope was                         passed under direct vision. Throughout the procedure,                         the patient's blood pressure, pulse, and oxygen                         saturations were monitored continuously. The  Colonoscope was introduced through the anus and                         advanced to the the cecum, identified by appendiceal                         orifice and ileocecal valve. The colonoscopy was                         performed without difficulty. The patient tolerated                         the procedure well. The quality of the bowel                         preparation was evaluated using the BBPS [facility] Bowel                         Preparation Scale) with scores of: Right Colon = 3,                         Transverse Colon = 3 and Left Colon = 3 (entire mucosa                         seen well with no residual  staining, small fragments                         of stool or opaque liquid). The total BBPS score                         equals 9. The ileocecal valve, appendiceal orifice,                         and rectum were photographed. Findings:      The perianal and digital rectal examinations were normal. Pertinent       negatives include normal sphincter tone and no palpable rectal lesions.      A 12 mm polyp was found in the cecum. The polyp was flat. Preparations       were made for mucosal resection. Demarcation of the lesion was performed       with narrow band imaging to clearly identify the boundaries of the       lesion. Eleview was injected to raise the lesion. Snare mucosal       resection was performed. Resection and retrieval were complete. Resected       tissue including tissue margins will be examined by histology. To       prevent bleeding after mucosal resection, one hemostatic clip was       successfully placed (MR safe). Clip manufacturer: [employer].       There was no bleeding during, or at the end, of the procedure. Estimated       blood loss: none.      A 5 mm polyp was found in the cecum. The polyp was sessile. The polyp       was removed with a cold snare. Resection and retrieval were complete.       Estimated blood loss was minimal. To prevent bleeding after the  polypectomy, one hemostatic clip was successfully placed (MR safe). Clip       manufacturer: [employer]. There was no bleeding at the end of the       procedure.      A diminutive polyp was found in the cecum. The polyp was sessile. The       polyp was removed with a cold biopsy forceps. Resection and retrieval       were complete.      Two sessile polyps were found in the rectum and sigmoid colon. The       polyps were 3 to 4 mm in size. These polyps were removed with a cold       snare. Resection and retrieval were complete. Estimated blood loss: none.      The retroflexed view of the  distal rectum and anal verge was normal and       showed no anal or rectal abnormalities.      A few small-mouthed diverticula were found in the recto-sigmoid colon. Impression:            - One 12 mm polyp in the cecum, removed with mucosal                         resection. Resected and retrieved. Clip manufacturer:                         [employer]. Clip (MR safe) was placed.                        - One 5 mm polyp in the cecum, removed with a cold                         snare. Resected and retrieved. Clip manufacturer:                         [employer]. Clip (MR safe) was placed.                        - One diminutive polyp in the cecum, removed with a                         cold biopsy forceps. Resected and retrieved.                        - Two 3 to 4 mm polyps in the rectum and in the                         sigmoid colon, removed with a cold snare. Resected and                         retrieved.                        - The distal rectum and anal verge are normal on                         retroflexion view.                        - Diverticulosis in  the recto-sigmoid colon.                        - Mucosal resection was performed. Resection and                         retrieval were complete. Recommendation:        - Discharge patient to home (with escort).                        - Resume previous diet today.                        - Continue present medications.                        - Await pathology results.                        - Repeat colonoscopy in 3 - 5 years for surveillance                         based on pathology results. Procedure Code(s):     --- Professional ---                        [phone], Colonoscopy, flexible; with endoscopic mucosal                         resection                        45385, 73, Colonoscopy, flexible; with removal of                         tumor(s), polyp(s), or other lesion(s) by snare                          technique                        45380, 103, Colonoscopy, flexible; with biopsy, single                         or multiple Diagnosis Code(s):     --- Professional ---                        Z12.11, Encounter for screening for malignant neoplasm                         of colon                        D12.0, Benign neoplasm of cecum                        D12.8, Benign neoplasm of rectum                        D12.5, Benign neoplasm of sigmoid colon                        K57.30, Diverticulosis of  large intestine without                         perforation or abscess without bleeding CPT copyright 2022 American Medical Association. All rights reserved. The codes documented in this report are preliminary and upon coder review may  be revised to meet current compliance requirements. Dr. Ulyess Mort Lin Landsman MD, MD 01/14/2023 8:58:25 AM This report has been signed electronically. Number of Addenda: 0 Note Initiated On: 01/14/2023 8:12 AM Scope Withdrawal Time: 0 hours 25 minutes 34 seconds  Total Procedure Duration: 0 hours 28 minutes 47 seconds  Estimated Blood Loss:  Estimated blood loss: none.      [facility]

## 2023-01-14 NOTE — H&P (Signed)
Cephas Darby, MD [address]  [place]  [place], [place] [address]  Main: [phone]  Fax: [phone] Pager: [phone]  Primary Care Physician:  Crecencio Mc, MD Primary Gastroenterologist:  Dr. Cephas Darby  Pre-Procedure History & Physical: HPI:  Mackenzie Dean is a 72 y.o. female is here for an colonoscopy.   Past Medical History:  Diagnosis Date   Allergy    Basal cell carcinoma 02/26/2015   L lat breast - excision   Breast cancer ([place]) 2001   chemo and radiation   Chicken pox    Heart murmur    [employer]   Hyperlipidemia    Personal history of chemotherapy 2001   left breast ca   Personal history of radiation therapy 2001   left breast ca    Past Surgical History:  Procedure Laterality Date   BREAST BIOPSY Right 2001   negative   BREAST BIOPSY Left 2001   breast cancer invasive stage 1 ER +   BREAST LUMPECTOMY Left 2001   invasive mammary carcinoma   BREAST SURGERY Left 2002   DILATION AND CURETTAGE OF UTERUS      Prior to Admission medications   Medication Sig Start Date End Date Taking? Authorizing Provider  cetirizine (ZYRTEC) 10 MG tablet Take 10 mg by mouth daily.   Yes [provider]  rosuvastatin (CRESTOR) 10 MG tablet Take 1 tablet (10 mg total) by mouth every other day. 11/17/22 11/06/24 Yes Crecencio Mc, MD  alendronate (FOSAMAX) 70 MG tablet Take 1 tablet (70 mg total) by mouth every 7 (seven) days Take with a full glass of water. Do not lie down for the next 30 min. 07/17/22   Crecencio Mc, MD  Calcium Carbonate (CALCIUM 500 PO) Take by mouth daily in the afternoon.    [provider]  estradiol (ESTRACE VAGINAL) 0.1 MG/GM vaginal cream PLACE 1 APPLICATORFUL VAGINALLY 2 TIMES A WEEK 10/27/22   Crecencio Mc, MD  Multiple Vitamins-Minerals (MULTIVITAMIN PO) Take 1 capsule by mouth daily.    [provider]  pneumococcal 20-valent conjugate vaccine (PREVNAR 20) 0.5 ML injection Inject into the  muscle. 10/22/22   Carlyle Basques, MD  propranolol (INDERAL) 20 MG tablet Take 1 tablet (20 mg total) by mouth 3 (three) times daily as needed. Patient not taking: Reported on 01/14/2023 02/01/19   Minna Merritts, MD  tretinoin (RETIN-A) 0.025 % cream Apply topically at bedtime. 07/14/22 07/14/23  Ralene Bathe, MD    Allergies as of 11/19/2022 - Review Complete 10/14/2022  Allergen Reaction Noted   Augmentin [amoxicillin-pot clavulanate] Diarrhea 11/30/2013    Family History  Problem Relation Age of Onset   Mental retardation Mother    Heart disease Mother    Arrhythmia Mother    Dementia Mother    Heart disease Father    Stroke Father    Heart failure Paternal Aunt    Breast cancer Cousin    Breast cancer Cousin     Social History   Socioeconomic History   Marital status: Married    Spouse name: Not on file   Number of children: Not on file   Years of education: Not on file   Highest education level: Not on file  Occupational History   Not on file  Tobacco Use   Smoking status: Never    Passive exposure: Yes   Smokeless tobacco: Never  Vaping Use   Vaping Use: Never used  Substance and Sexual Activity  Alcohol use: Yes    Alcohol/week: 1.0 standard drink of alcohol    Types: 1 Glasses of wine per week    Comment: occassional   Drug use: No   Sexual activity: Yes  Other Topics Concern   Not on file  Social History Narrative   Not on file   Social Determinants of Health   Financial Resource Strain: Not on file  Food Insecurity: Not on file  Transportation Needs: Not on file  Physical Activity: Not on file  Stress: Not on file  Social Connections: Not on file  Intimate Partner Violence: Not on file    Review of Systems: See HPI, otherwise negative ROS  Physical Exam: BP 112/82   Pulse 84   Temp (!) 96.4 F (35.8 C) (Temporal)   Resp 16   Wt 53 kg   SpO2 100%   BMI 20.37 kg/m  General:   Alert,  pleasant and cooperative in NAD Head:   Normocephalic and atraumatic. Neck:  Supple; no masses or thyromegaly. Lungs:  Clear throughout to auscultation.    Heart:  Regular rate and rhythm. Abdomen:  Soft, nontender and nondistended. Normal bowel sounds, without guarding, and without rebound.   Neurologic:  Alert and  oriented x4;  grossly normal neurologically.  Impression/Plan: Mackenzie Dean is here for an colonoscopy to be performed for colon cancer screening  Risks, benefits, limitations, and alternatives regarding  colonoscopy have been reviewed with the patient.  Questions have been answered.  All parties agreeable.   Sherri Sear, MD  01/14/2023, 8:17 AM

## 2023-01-14 NOTE — Transfer of Care (Signed)
Immediate Anesthesia Transfer of Care Note  Patient: RIKA BEAM  Procedure(s) Performed: COLONOSCOPY WITH PROPOFOL  Patient Location: Endoscopy Unit  Anesthesia Type:General  Level of Consciousness: sedated  Airway & Oxygen Therapy: Patient Spontanous Breathing  Post-op Assessment: Report given to RN and Post -op Vital signs reviewed and stable  Post vital signs: Reviewed and stable  Last Vitals:  Vitals Value Taken Time  BP 129/61 01/14/23 0859  Temp 35.9 C 01/14/23 0857  Pulse 76 01/14/23 0859  Resp 14 01/14/23 0859  SpO2 100 % 01/14/23 0859  Vitals shown include unvalidated device data.  Last Pain:  Vitals:   01/14/23 0857  TempSrc: Temporal  PainSc: Asleep         Complications: No notable events documented.

## 2023-01-14 NOTE — Anesthesia Postprocedure Evaluation (Signed)
Anesthesia Post Note  Patient: Mackenzie Dean  Procedure(s) Performed: COLONOSCOPY WITH PROPOFOL  Patient location during evaluation: PACU Anesthesia Type: General Level of consciousness: awake and alert, oriented and patient cooperative Pain management: pain level controlled Vital Signs Assessment: post-procedure vital signs reviewed and stable Respiratory status: spontaneous breathing, nonlabored ventilation and respiratory function stable Cardiovascular status: blood pressure returned to baseline and stable Postop Assessment: adequate PO intake Anesthetic complications: no   No notable events documented.   Last Vitals:  Vitals:   01/14/23 0908 01/14/23 0918  BP: 113/60 125/72  Pulse: 79 77  Resp: 19 14  Temp:    SpO2: 100%     Last Pain:  Vitals:   01/14/23 0918  TempSrc:   PainSc: 0-No pain                 Darrin Nipper

## 2023-01-14 NOTE — Anesthesia Preprocedure Evaluation (Addendum)
Anesthesia Evaluation  Patient identified by MRN, date of birth, ID band Patient awake    Reviewed: Allergy & Precautions, NPO status , Patient's Chart, lab work & pertinent test results  History of Anesthesia Complications Negative for: history of anesthetic complications  Airway Mallampati: II   Neck ROM: Full    Dental no notable dental hx.    Pulmonary neg pulmonary ROS   Pulmonary exam normal breath sounds clear to auscultation       Cardiovascular Exercise Tolerance: Good Normal cardiovascular exam Rhythm:Regular Rate:Normal  Paroxysmal tachycardia  ECG 11/24/21: NSR. 83 bpm. Possible left atrial enlargement   Neuro/Psych negative neurological ROS     GI/Hepatic negative GI ROS,,,  Endo/Other  negative endocrine ROS    Renal/GU negative Renal ROS     Musculoskeletal   Abdominal   Peds  Hematology Breast CA   Anesthesia Other Findings Cardiology note 11/24/21:  Paroxysmal tachycardia ([place]) -  Etiology unclear, she does have a prior prescription for propranolol 20 mg to take as needed for prolonged episodes ZIO monitor could be performed for more frequent episodes   Chest pain/angina Having pain over the past 6 months, worse at altitude recently in [place] climbing hills Concerning for angina After further discussion concerning various treatment options, we have ordered a cardiac CTA for further evaluation   Thoracic aortic atherosclerosis ([place]) Minimal plaque on prior CT scan   Chest pain, unspecified type -  Cardiac CTA ordered as above   Mixed hyperlipidemia history of radiation, elevated LDL in the 120s Crestor 5 daily   Reproductive/Obstetrics                             Anesthesia Physical Anesthesia Plan  ASA: 2  Anesthesia Plan: General   Post-op Pain Management:    Induction: Intravenous  PONV Risk Score and Plan: 3 and Propofol infusion, TIVA and Treatment  may vary due to age or medical condition  Airway Management Planned: Natural Airway  Additional Equipment:   Intra-op Plan:   Post-operative Plan:   Informed Consent: I have reviewed the patients History and Physical, chart, labs and discussed the procedure including the risks, benefits and alternatives for the proposed anesthesia with the patient or authorized representative who has indicated his/her understanding and acceptance.       Plan Discussed with: CRNA  Anesthesia Plan Comments: (LMA/GETA backup discussed.  Patient consented for risks of anesthesia including but not limited to:  - adverse reactions to medications - damage to eyes, teeth, lips or other oral mucosa - nerve damage due to positioning  - sore throat or hoarseness - damage to heart, brain, nerves, lungs, other parts of body or loss of life  Informed patient about role of CRNA in peri- and intra-operative care.  Patient voiced understanding.)        Anesthesia Quick Evaluation

## 2023-01-14 NOTE — Anesthesia Procedure Notes (Signed)
Date/Time: 01/14/2023 8:22 AM  Performed by: Johnna Acosta, CRNAPre-anesthesia Checklist: Patient identified, Emergency Drugs available, Patient being monitored, Suction available and Timeout performed Patient Re-evaluated:Patient Re-evaluated prior to induction Oxygen Delivery Method: Nasal cannula Preoxygenation: Pre-oxygenation with 100% oxygen Induction Type: IV induction Ventilation: Mask ventilation without difficulty

## 2023-01-15 ENCOUNTER — Encounter: Payer: Self-pay | Admitting: Gastroenterology

## 2023-01-15 LAB — SURGICAL PATHOLOGY

## 2023-01-16 ENCOUNTER — Encounter: Payer: Self-pay | Admitting: Gastroenterology

## 2023-01-20 DIAGNOSIS — M542 Cervicalgia: Secondary | ICD-10-CM | POA: Diagnosis not present

## 2023-02-09 DIAGNOSIS — M542 Cervicalgia: Secondary | ICD-10-CM | POA: Diagnosis not present

## 2023-02-24 ENCOUNTER — Telehealth: Payer: Self-pay | Admitting: Internal Medicine

## 2023-02-24 NOTE — Telephone Encounter (Signed)
Contacted Mackenzie Dean to schedule their annual wellness visit. Appointment made for 03/04/2023.  Thank you,  [facility] Support [name] Direct dial  [phone]

## 2023-03-03 ENCOUNTER — Other Ambulatory Visit: Payer: Self-pay

## 2023-03-04 ENCOUNTER — Ambulatory Visit (INDEPENDENT_AMBULATORY_CARE_PROVIDER_SITE_OTHER): Payer: PPO

## 2023-03-04 VITALS — Ht 63.5 in | Wt 116.0 lb

## 2023-03-04 DIAGNOSIS — Z0000 Encounter for general adult medical examination without abnormal findings: Secondary | ICD-10-CM

## 2023-03-04 NOTE — Patient Instructions (Addendum)
Mackenzie Dean , Thank you for taking time to come for your Medicare Wellness Visit. I appreciate your ongoing commitment to your health goals. Please review the following plan we discussed and let me know if I can assist you in the future.   These are the goals we discussed:  Goals      Increase physical activity     Walk more for exercise        This is a list of the screening recommended for you and due dates:  Health Maintenance  Topic Date Due   COVID-19 Vaccine (7 - 2023-24 season) 03/20/2023*   DTaP/Tdap/Td vaccine (2 - Td or Tdap) 06/06/2023   Flu Shot  06/17/2023   Mammogram  08/05/2023   Medicare Annual Wellness Visit  03/03/2024   Colon Cancer Screening  01/14/2028   Pneumonia Vaccine  Completed   DEXA scan (bone density measurement)  Completed   Hepatitis C Screening: USPSTF Recommendation to screen - Ages 67-79 yo.  Completed   Zoster (Shingles) Vaccine  Completed   HPV Vaccine  Aged Out  *Topic was postponed. The date shown is not the original due date.    Advanced directives: End of life planning; Advance aging; Advanced directives discussed.  Copy of current HCPOA/Living Will requested.    Conditions/risks identified: none new.  Next appointment: Follow up in one year for your annual wellness visit    Preventive Care 65 Years and Older, Female Preventive care refers to lifestyle choices and visits with your health care provider that can promote health and wellness. What does preventive care include? A yearly physical exam. This is also called an annual well check. Dental exams once or twice a year. Routine eye exams. Ask your health care provider how often you should have your eyes checked. Personal lifestyle choices, including: Daily care of your teeth and gums. Regular physical activity. Eating a healthy diet. Avoiding tobacco and drug use. Limiting alcohol use. Practicing safe sex. Taking low-dose aspirin every day. Taking vitamin and mineral  supplements as recommended by your health care provider. What happens during an annual well check? The services and screenings done by your health care provider during your annual well check will depend on your age, overall health, lifestyle risk factors, and family history of disease. Counseling  Your health care provider may ask you questions about your: Alcohol use. Tobacco use. Drug use. Emotional well-being. Home and relationship well-being. Sexual activity. Eating habits. History of falls. Memory and ability to understand (cognition). Work and work Astronomer. Reproductive health. Screening  You may have the following tests or measurements: Height, weight, and BMI. Blood pressure. Lipid and cholesterol levels. These may be checked every 5 years, or more frequently if you are over 66 years old. Skin check. Lung cancer screening. You may have this screening every year starting at age 85 if you have a 30-pack-year history of smoking and currently smoke or have quit within the past 15 years. Fecal occult blood test (FOBT) of the stool. You may have this test every year starting at age 64. Flexible sigmoidoscopy or colonoscopy. You may have a sigmoidoscopy every 5 years or a colonoscopy every 10 years starting at age 52. Hepatitis C blood test. Hepatitis B blood test. Sexually transmitted disease (STD) testing. Diabetes screening. This is done by checking your blood sugar (glucose) after you have not eaten for a while (fasting). You may have this done every 1-3 years. Bone density scan. This is done to screen for osteoporosis. You  may have this done starting at age 55. Mammogram. This may be done every 1-2 years. Talk to your health care provider about how often you should have regular mammograms. Talk with your health care provider about your test results, treatment options, and if necessary, the need for more tests. Vaccines  Your health care provider may recommend certain  vaccines, such as: Influenza vaccine. This is recommended every year. Tetanus, diphtheria, and acellular pertussis (Tdap, Td) vaccine. You may need a Td booster every 10 years. Zoster vaccine. You may need this after age 44. Pneumococcal 13-valent conjugate (PCV13) vaccine. One dose is recommended after age 5. Pneumococcal polysaccharide (PPSV23) vaccine. One dose is recommended after age 66. Talk to your health care provider about which screenings and vaccines you need and how often you need them. This information is not intended to replace advice given to you by your health care provider. Make sure you discuss any questions you have with your health care provider. Document Released: 11/29/2015 Document Revised: 07/22/2016 Document Reviewed: 09/03/2015 Elsevier Interactive Patient Education  2017 [place] Prevention in the Home Falls can cause injuries. They can happen to people of all ages. There are many things you can do to make your home safe and to help prevent falls. What can I do on the outside of my home? Regularly fix the edges of walkways and driveways and fix any cracks. Remove anything that might make you trip as you walk through a door, such as a raised step or threshold. Trim any bushes or trees on the path to your home. Use bright outdoor lighting. Clear any walking paths of anything that might make someone trip, such as rocks or tools. Regularly check to see if handrails are loose or broken. Make sure that both sides of any steps have handrails. Any raised decks and porches should have guardrails on the edges. Have any leaves, snow, or ice cleared regularly. Use sand or salt on walking paths during winter. Clean up any spills in your garage right away. This includes oil or grease spills. What can I do in the bathroom? Use night lights. Install grab bars by the toilet and in the tub and shower. Do not use towel bars as grab bars. Use non-skid mats or decals in  the tub or shower. If you need to sit down in the shower, use a plastic, non-slip stool. Keep the floor dry. Clean up any water that spills on the floor as soon as it happens. Remove soap buildup in the tub or shower regularly. Attach bath mats securely with double-sided non-slip rug tape. Do not have throw rugs and other things on the floor that can make you trip. What can I do in the bedroom? Use night lights. Make sure that you have a light by your bed that is easy to reach. Do not use any sheets or blankets that are too big for your bed. They should not hang down onto the floor. Have a firm chair that has side arms. You can use this for support while you get dressed. Do not have throw rugs and other things on the floor that can make you trip. What can I do in the kitchen? Clean up any spills right away. Avoid walking on wet floors. Keep items that you use a lot in easy-to-reach places. If you need to reach something above you, use a strong step stool that has a grab bar. Keep electrical cords out of the way. Do not use floor  polish or wax that makes floors slippery. If you must use wax, use non-skid floor wax. Do not have throw rugs and other things on the floor that can make you trip. What can I do with my stairs? Do not leave any items on the stairs. Make sure that there are handrails on both sides of the stairs and use them. Fix handrails that are broken or loose. Make sure that handrails are as long as the stairways. Check any carpeting to make sure that it is firmly attached to the stairs. Fix any carpet that is loose or worn. Avoid having throw rugs at the top or bottom of the stairs. If you do have throw rugs, attach them to the floor with carpet tape. Make sure that you have a light switch at the top of the stairs and the bottom of the stairs. If you do not have them, ask someone to add them for you. What else can I do to help prevent falls? Wear shoes that: Do not have high  heels. Have rubber bottoms. Are comfortable and fit you well. Are closed at the toe. Do not wear sandals. If you use a stepladder: Make sure that it is fully opened. Do not climb a closed stepladder. Make sure that both sides of the stepladder are locked into place. Ask someone to hold it for you, if possible. Clearly mark and make sure that you can see: Any grab bars or handrails. First and last steps. Where the edge of each step is. Use tools that help you move around (mobility aids) if they are needed. These include: Canes. Walkers. Scooters. Crutches. Turn on the lights when you go into a dark area. Replace any light bulbs as soon as they burn out. Set up your furniture so you have a clear path. Avoid moving your furniture around. If any of your floors are uneven, fix them. If there are any pets around you, be aware of where they are. Review your medicines with your doctor. Some medicines can make you feel dizzy. This can increase your chance of falling. Ask your doctor what other things that you can do to help prevent falls. This information is not intended to replace advice given to you by your health care provider. Make sure you discuss any questions you have with your health care provider. Document Released: 08/29/2009 Document Revised: 04/09/2016 Document Reviewed: 12/07/2014 Elsevier Interactive Patient Education  2017 [employer].

## 2023-03-04 NOTE — Progress Notes (Signed)
Subjective:   Mackenzie Dean is a 72 y.o. female who presents for an Initial Medicare Annual Wellness Visit.  Review of Systems    No ROS.  Medicare Wellness Virtual Visit.  Visual/audio telehealth visit, UTA vital signs.   See social history for additional risk factors.   Cardiac Risk Factors include: advanced age (>21men, >78 women)     Objective:    Today's Vitals   03/04/23 1607  Weight: 116 lb (52.6 kg)  Height: 5' 3.5" (1.613 m)   Body mass index is 20.23 kg/m.     03/04/2023    4:12 PM 01/14/2023    7:42 AM 09/24/2017    7:00 PM  Advanced Directives  Does Patient Have a Medical Advance Directive? Yes Yes No  Type of Estate agent of [place];Living will    Does patient want to make changes to medical advance directive? No - Patient declined    Copy of Healthcare Power of Attorney in Chart? No - copy requested    Would patient like information on creating a medical advance directive?   No - Patient declined    Current Medications (verified) Outpatient Encounter Medications as of 03/04/2023  Medication Sig   alendronate (FOSAMAX) 70 MG tablet Take 1 tablet (70 mg total) by mouth every 7 (seven) days Take with a full glass of water. Do not lie down for the next 30 min.   Calcium Carbonate (CALCIUM 500 PO) Take by mouth daily in the afternoon.   cetirizine (ZYRTEC) 10 MG tablet Take 10 mg by mouth daily.   estradiol (ESTRACE VAGINAL) 0.1 MG/GM vaginal cream PLACE 1 APPLICATORFUL VAGINALLY 2 TIMES A WEEK   Multiple Vitamins-Minerals (MULTIVITAMIN PO) Take 1 capsule by mouth daily.   pneumococcal 20-valent conjugate vaccine (PREVNAR 20) 0.5 ML injection Inject into the muscle.   rosuvastatin (CRESTOR) 10 MG tablet Take 1 tablet (10 mg total) by mouth every other day.   tretinoin (RETIN-A) 0.025 % cream Apply topically at bedtime.   No facility-administered encounter medications on file as of 03/04/2023.    Allergies (verified) Augmentin  [amoxicillin-pot clavulanate]   History: Past Medical History:  Diagnosis Date   Allergy    Basal cell carcinoma 02/26/2015   L lat breast - excision   Breast cancer 2001   chemo and radiation   Chicken pox    Heart murmur    [employer]   Hyperlipidemia    Personal history of chemotherapy 2001   left breast ca   Personal history of radiation therapy 2001   left breast ca   Past Surgical History:  Procedure Laterality Date   BREAST BIOPSY Right 2001   negative   BREAST BIOPSY Left 2001   breast cancer invasive stage 1 ER +   BREAST LUMPECTOMY Left 2001   invasive mammary carcinoma   BREAST SURGERY Left 2002   COLONOSCOPY WITH PROPOFOL N/A 01/14/2023   Procedure: COLONOSCOPY WITH PROPOFOL;  Surgeon: Toney Reil, MD;  Location: [facility] ENDOSCOPY;  Service: Gastroenterology;  Laterality: N/A;   DILATION AND CURETTAGE OF UTERUS     Family History  Problem Relation Age of Onset   Mental retardation Mother    Heart disease Mother    Arrhythmia Mother    Dementia Mother    Heart disease Father    Stroke Father    Heart failure Paternal Aunt    Breast cancer Cousin    Breast cancer Cousin    Social History   Socioeconomic History  Marital status: Married    Spouse name: Not on file   Number of children: Not on file   Years of education: Not on file   Highest education level: Not on file  Occupational History   Not on file  Tobacco Use   Smoking status: Never    Passive exposure: Yes   Smokeless tobacco: Never  Vaping Use   Vaping Use: Never used  Substance and Sexual Activity   Alcohol use: Yes    Alcohol/week: 1.0 standard drink of alcohol    Types: 1 Glasses of wine per week    Comment: occassional   Drug use: No   Sexual activity: Yes  Other Topics Concern   Not on file  Social History Narrative   Not on file   Social Determinants of Health   Financial Resource Strain: Low Risk  (03/04/2023)   Overall Financial Resource Strain (CARDIA)     Difficulty of Paying Living Expenses: Not hard at all  Food Insecurity: No Food Insecurity (03/04/2023)   Hunger Vital Sign    Worried About Running Out of Food in the Last Year: Never true    Ran Out of Food in the Last Year: Never true  Transportation Needs: No Transportation Needs (03/04/2023)   PRAPARE - Administrator, Civil Service (Medical): No    Lack of Transportation (Non-Medical): No  Physical Activity: Sufficiently Active (03/04/2023)   Exercise Vital Sign    Days of Exercise per Week: 5 days    Minutes of Exercise per Session: 30 min  Stress: No Stress Concern Present (03/04/2023)   [employer] of Occupational Health - Occupational Stress Questionnaire    Feeling of Stress : Only a little  Social Connections: Unknown (03/04/2023)   Social Connection and Isolation Panel [NHANES]    Frequency of Communication with Friends and Family: Not on file    Frequency of Social Gatherings with Friends and Family: Not on file    Attends Religious Services: Not on Marketing executive or Organizations: Not on file    Attends Banker Meetings: Not on file    Marital Status: Married    Tobacco Counseling Counseling given: Not Answered   Clinical Intake:  Pre-visit preparation completed: Yes        Diabetes: No  How often do you need to have someone help you when you read instructions, pamphlets, or other written materials from your doctor or pharmacy?: 1 - Never    Interpreter Needed?: No      Activities of Daily Living    03/04/2023    4:12 PM 10/14/2022    9:14 AM  In your present state of health, do you have any difficulty performing the following activities:  Hearing? 0 0  Vision? 0 0  Difficulty concentrating or making decisions? 0 0  Walking or climbing stairs? 0 0  Dressing or bathing? 0 0  Doing errands, shopping? 0 0  Preparing Food and eating ? N   Using the Toilet? N   In the past six months, have you accidently  leaked urine? N   Do you have problems with loss of bowel control? N   Managing your Medications? N   Managing your Finances? N   Housekeeping or managing your Housekeeping? N     Patient Care Team: Sherlene Shams, MD as PCP - General (Internal Medicine)  Indicate any recent Medical Services you may have received from other than [facility] providers  in the past year (date may be approximate).     Assessment:   This is a routine wellness examination for Amina.  I connected with  JANEY PETRON on 03/04/23 by a audio enabled telemedicine application and verified that I am speaking with the correct person using two identifiers.  Patient Location: Home  Provider Location: Office/Clinic  I discussed the limitations of evaluation and management by telemedicine. The patient expressed understanding and agreed to proceed.   Hearing/Vision screen Hearing Screening - Comments:: Patient is able to hear conversational tones without difficulty.  No issues reported.   Vision Screening - Comments:: Followed by Dr. Ancil Boozer Wears corrective lenses Cataract extraction, bilateral They have seen their ophthalmologist in the last 12 months.    Dietary issues and exercise activities discussed: Current Exercise Habits: Home exercise routine, Type of exercise: walking;yoga, Time (Minutes): 60, Frequency (Times/Week): 5, Weekly Exercise (Minutes/Week): 300, Intensity: Mild Healthy diet Good water intake   Goals Addressed             This Visit's Progress    Increase physical activity       Walk more for exercise       Depression Screen    03/04/2023    4:24 PM 10/14/2022    9:14 AM 03/31/2022    2:36 PM 10/13/2021    1:41 PM 09/16/2020    2:16 PM 11/17/2017    3:50 PM  PHQ 2/9 Scores  PHQ - 2 Score 0 0 0 0 0 0  PHQ- 9 Score      0    Fall Risk    03/04/2023    4:25 PM 10/14/2022    9:14 AM 03/31/2022    2:36 PM 10/13/2021    1:41 PM 04/10/2021   11:27 AM  Fall Risk   Falls  in the past year? 0 0 0 0 0  Number falls in past yr: 0      Injury with Fall? 0      Risk for fall due to :  No Fall Risks No Fall Risks No Fall Risks   Follow up Falls evaluation completed;Falls prevention discussed Falls evaluation completed Falls evaluation completed Falls evaluation completed Falls evaluation completed    FALL RISK PREVENTION PERTAINING TO THE HOME: Home free of loose throw rugs in walkways, pet beds, electrical cords, etc? Yes  Adequate lighting in your home to reduce risk of falls? Yes   ASSISTIVE DEVICES UTILIZED TO PREVENT FALLS: Life alert? No  Use of a cane, walker or w/c? No   TIMED UP AND GO: Was the test performed? No .   Cognitive Function:        03/04/2023    4:25 PM  6CIT Screen  What Year? 0 points  What month? 0 points  What time? 0 points  Count back from 20 0 points  Months in reverse 0 points  Repeat phrase 0 points  Total Score 0 points    Immunizations Immunization History  Administered Date(s) Administered   COVID-19, mRNA, vaccine(Comirnaty)12 years and older 10/02/2022   Fluad Quad(high Dose 65+) 08/15/2019, 09/16/2020, 10/13/2021, 09/01/2022   Influenza Split 08/20/2014, 09/17/2017   Influenza-Unspecified 08/17/2016, 09/21/2018   PFIZER Comirnaty(Gray Top)Covid-19 Tri-Sucrose Vaccine 05/02/2021   PFIZER(Purple Top)SARS-COV-2 Vaccination 11/23/2019, 12/14/2019, 08/16/2020   PNEUMOCOCCAL CONJUGATE-20 10/28/2022   Pfizer Covid-19 Vaccine Bivalent Booster 28yrs & up 10/23/2021   Tdap 06/05/2013   Zoster Recombinat (Shingrix) 12/13/2018, 12/23/2018, 12/23/2018, 07/19/2019, 07/19/2019   Zoster, Live 06/11/2014  Screening Tests Health Maintenance  Topic Date Due   COVID-19 Vaccine (7 - 2023-24 season) 03/20/2023 (Originally 11/27/2022)   DTaP/Tdap/Td (2 - Td or Tdap) 06/06/2023   INFLUENZA VACCINE  06/17/2023   MAMMOGRAM  08/05/2023   Medicare Annual Wellness (AWV)  03/03/2024   COLONOSCOPY (Pts 45-52yrs Insurance  coverage will need to be confirmed)  01/14/2028   Pneumonia Vaccine 49+ Years old  Completed   DEXA SCAN  Completed   Hepatitis C Screening  Completed   Zoster Vaccines- Shingrix  Completed   HPV VACCINES  Aged Out   Health Maintenance There are no preventive care reminders to display for this patient.  Bone density- scheduled 05/11/23.   MRI- scheduled 06/23/23 for breast exam/mammogram at [facility].   Lung Cancer Screening: (Low Dose CT Chest recommended if Age 22-80 years, 30 pack-year currently smoking OR have quit w/in 15years.) does not qualify.   Hepatitis C Screening: Completed 06/2015.   Vision Screening: Recommended annual ophthalmology exams for early detection of glaucoma and other disorders of the eye.  Dental Screening: Recommended annual dental exams for proper oral hygiene  Community Resource Referral / Chronic Care Management: CRR required this visit?  No   CCM required this visit?  No      Plan:     I have personally reviewed and noted the following in the patient's chart:   Medical and social history Use of alcohol, tobacco or illicit drugs  Current medications and supplements including opioid prescriptions. Patient is not currently taking opioid prescriptions. Functional ability and status Nutritional status Physical activity Advanced directives List of other physicians Hospitalizations, surgeries, and ER visits in previous 12 months Vitals Screenings to include cognitive, depression, and falls Referrals and appointments  In addition, I have reviewed and discussed with patient certain preventive protocols, quality metrics, and best practice recommendations. A written personalized care plan for preventive services as well as general preventive health recommendations were provided to patient.     Cathey Endow, LPN   1/61/0960

## 2023-03-08 DIAGNOSIS — Z7189 Other specified counseling: Secondary | ICD-10-CM | POA: Diagnosis not present

## 2023-03-09 DIAGNOSIS — M542 Cervicalgia: Secondary | ICD-10-CM | POA: Diagnosis not present

## 2023-03-19 DIAGNOSIS — Z7189 Other specified counseling: Secondary | ICD-10-CM | POA: Diagnosis not present

## 2023-03-24 ENCOUNTER — Other Ambulatory Visit (HOSPITAL_COMMUNITY): Payer: Self-pay | Admitting: Neurology

## 2023-03-24 DIAGNOSIS — Z7189 Other specified counseling: Secondary | ICD-10-CM

## 2023-04-06 ENCOUNTER — Ambulatory Visit (HOSPITAL_COMMUNITY): Payer: PPO

## 2023-04-07 ENCOUNTER — Ambulatory Visit (HOSPITAL_COMMUNITY)
Admission: RE | Admit: 2023-04-07 | Discharge: 2023-04-07 | Disposition: A | Discharge status: Home / Self Care | Payer: PPO | Source: Ambulatory Visit | Attending: Neurology | Admitting: Neurology

## 2023-04-07 DIAGNOSIS — Z7189 Other specified counseling: Secondary | ICD-10-CM | POA: Insufficient documentation

## 2023-04-07 DIAGNOSIS — G9389 Other specified disorders of brain: Secondary | ICD-10-CM | POA: Diagnosis not present

## 2023-04-22 ENCOUNTER — Telehealth: Payer: Self-pay | Admitting: Internal Medicine

## 2023-04-22 NOTE — Telephone Encounter (Signed)
LVMTCB to make a follow-up with provider

## 2023-05-11 ENCOUNTER — Ambulatory Visit
Admission: RE | Admit: 2023-05-11 | Discharge: 2023-05-11 | Disposition: A | Discharge status: Home / Self Care | Payer: PPO | Source: Ambulatory Visit | Attending: Internal Medicine | Admitting: Internal Medicine

## 2023-05-11 DIAGNOSIS — M810 Age-related osteoporosis without current pathological fracture: Secondary | ICD-10-CM | POA: Diagnosis not present

## 2023-05-11 DIAGNOSIS — M8589 Other specified disorders of bone density and structure, multiple sites: Secondary | ICD-10-CM | POA: Diagnosis not present

## 2023-05-11 DIAGNOSIS — Z780 Asymptomatic menopausal state: Secondary | ICD-10-CM | POA: Diagnosis not present

## 2023-05-12 ENCOUNTER — Other Ambulatory Visit: Payer: Self-pay

## 2023-07-25 IMAGING — CT CT HEART MORP W/ CTA COR W/ SCORE W/ CA W/CM &/OR W/O CM
1 of 14 series · 3 of 20 positions shown, 4 images · non-contrast
Comparison: None.
COMPARISON: None.

Addendum:
EXAM:
OVER-READ INTERPRETATION  CT CHEST

The following report is an over-read performed by radiologist Dr.
Vedro Kerum [REDACTED] on 12/01/2021. This
over-read does not include interpretation of cardiac or coronary
anatomy or pathology. The coronary calcium score/coronary CTA
interpretation by the cardiologist is attached.
CLINICAL DATA: Chest pain, shortness of breath
Cardiac/Coronary  CTA
TECHNIQUE: The patient was scanned on a Siemens Somatom go.Top scanner.

[Series 25: multiphase % cta coronary 0.60 · axial · 0.31mm/px · z∈[-1090,-1031]mm · 3 of 3234 slices shown, 4 images]
[im [id number]/3234  vessel]
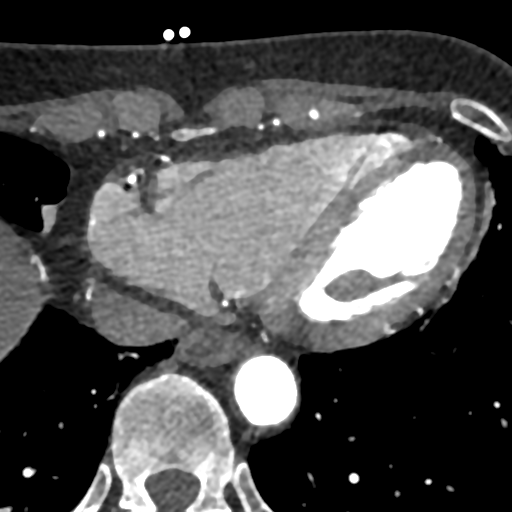
[im [id number]  lung]
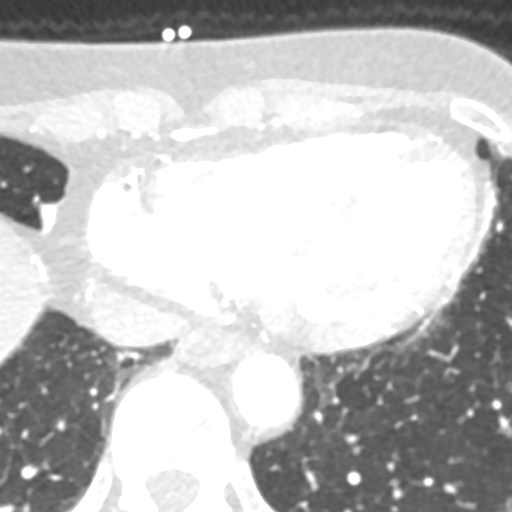
[im 1617/3234  vessel]
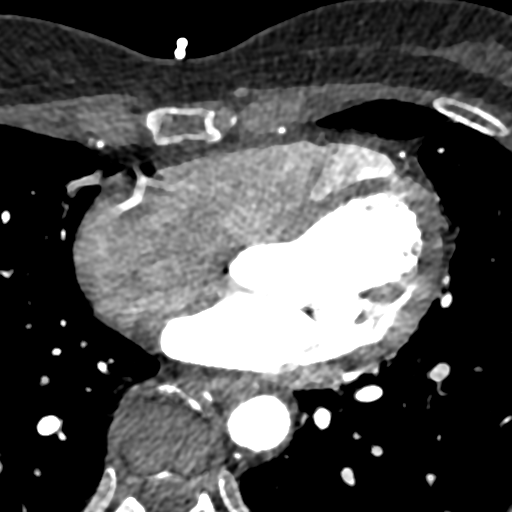
[im 2425/[id number]  vessel]
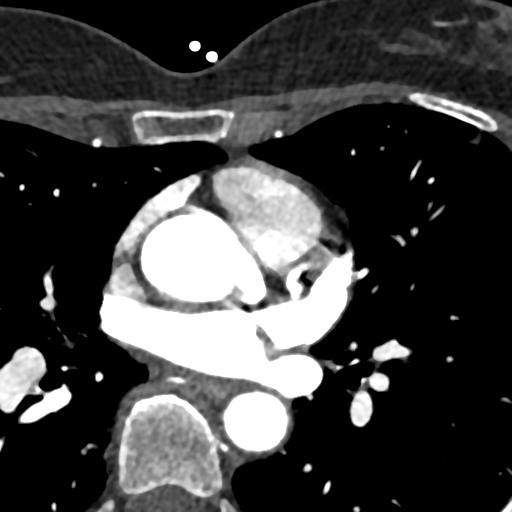

[3 of 20 positions shown; findings below may reference images not displayed]

FINDINGS: Limited view of the lung parenchyma demonstrates no suspicious
nodularity. Airways are normal.

Limited view of the mediastinum demonstrates no adenopathy.
Esophagus normal.

Limited view of the upper abdomen unremarkable.

Limited view of the skeleton and chest wall is unremarkable.
IMPRESSION: No significant extracardiac findings.
:
A retrospective scan was triggered in the descending thoracic aorta.
Axial non-contrast 3 mm slices were carried out through the heart.
The data set was analyzed on a dedicated work station and scored
using the Agatson method. Gantry rotation speed was 330 msecs and
collimation was .6 mm. 100mg of metoprolol and 0.8 mg of sl NTG was
given. The 3D data set was reconstructed in 5% intervals of the
60-95 % of the R-R cycle. Diastolic phases were analyzed on a
dedicated work station using MPR, MIP and VRT modes. The patient
received 75 cc of contrast.
FINDINGS: Aorta: Normal size. Mild aortic root and descending aorta
calcifications. No dissection.

Aortic Valve:  Trileaflet.  No calcifications.

Coronary Arteries:  Normal coronary origin.  Right dominance.

RCA is a dominant artery that gives rise to PDA and PLA. There is no
plaque.

Left main gives rise to LAD and LCX arteries.  LM has no disease.

LAD has no plaque.

LCX is a non-dominant artery that gives rise to one OM1 branch.
There is no plaque.

Other findings:

Normal pulmonary vein drainage into the left atrium.

Normal left atrial appendage without a thrombus.

Normal size of the pulmonary artery.
IMPRESSION: 1. Normal coronary calcium score of 0. Patient is low risk for
coronary events.

2. Normal coronary origin with right dominance.

3. No evidence of CAD.

4. CAD-RADS 0.  Consider non-atherosclerotic causes of chest pain.

*** End of Addendum ***
EXAM:
OVER-READ INTERPRETATION  CT CHEST

The following report is an over-read performed by radiologist Dr.
Vedro Kerum [REDACTED] on 12/01/2021. This
over-read does not include interpretation of cardiac or coronary
anatomy or pathology. The coronary calcium score/coronary CTA
interpretation by the cardiologist is attached.
FINDINGS: Limited view of the lung parenchyma demonstrates no suspicious
nodularity. Airways are normal.

Limited view of the mediastinum demonstrates no adenopathy.
Esophagus normal.

Limited view of the upper abdomen unremarkable.

Limited view of the skeleton and chest wall is unremarkable.
IMPRESSION: No significant extracardiac findings.

## 2023-08-02 ENCOUNTER — Other Ambulatory Visit: Payer: Self-pay

## 2023-08-02 ENCOUNTER — Other Ambulatory Visit: Payer: Self-pay | Admitting: Internal Medicine

## 2023-08-02 MED ORDER — ALENDRONATE SODIUM 70 MG PO TABS
70.0000 mg | ORAL_TABLET | ORAL | 1 refills | Status: DC
  Filled 2023-08-02: qty 4, 28d supply, fill #0
  Filled 2023-09-08: qty 4, 28d supply, fill #1

## 2023-08-04 ENCOUNTER — Ambulatory Visit: Payer: PPO | Admitting: Dermatology

## 2023-08-04 ENCOUNTER — Other Ambulatory Visit: Payer: Self-pay

## 2023-08-04 DIAGNOSIS — D1801 Hemangioma of skin and subcutaneous tissue: Secondary | ICD-10-CM

## 2023-08-04 DIAGNOSIS — W908XXA Exposure to other nonionizing radiation, initial encounter: Secondary | ICD-10-CM

## 2023-08-04 DIAGNOSIS — Z1283 Encounter for screening for malignant neoplasm of skin: Secondary | ICD-10-CM

## 2023-08-04 DIAGNOSIS — L578 Other skin changes due to chronic exposure to nonionizing radiation: Secondary | ICD-10-CM

## 2023-08-04 DIAGNOSIS — Z853 Personal history of malignant neoplasm of breast: Secondary | ICD-10-CM

## 2023-08-04 DIAGNOSIS — Z79899 Other long term (current) drug therapy: Secondary | ICD-10-CM

## 2023-08-04 DIAGNOSIS — L814 Other melanin hyperpigmentation: Secondary | ICD-10-CM | POA: Diagnosis not present

## 2023-08-04 DIAGNOSIS — L820 Inflamed seborrheic keratosis: Secondary | ICD-10-CM | POA: Diagnosis not present

## 2023-08-04 DIAGNOSIS — Z85828 Personal history of other malignant neoplasm of skin: Secondary | ICD-10-CM | POA: Diagnosis not present

## 2023-08-04 DIAGNOSIS — L700 Acne vulgaris: Secondary | ICD-10-CM | POA: Diagnosis not present

## 2023-08-04 DIAGNOSIS — L708 Other acne: Secondary | ICD-10-CM | POA: Diagnosis not present

## 2023-08-04 DIAGNOSIS — Z7189 Other specified counseling: Secondary | ICD-10-CM

## 2023-08-04 DIAGNOSIS — L821 Other seborrheic keratosis: Secondary | ICD-10-CM

## 2023-08-04 MED ORDER — TRETINOIN 0.025 % EX CREA
1.0000 | TOPICAL_CREAM | Freq: Every day | CUTANEOUS | 11 refills | Status: DC
  Filled 2023-08-04: qty 45, 90d supply, fill #0
  Filled 2023-11-04: qty 45, 30d supply, fill #0

## 2023-08-04 NOTE — Patient Instructions (Addendum)

## 2023-08-04 NOTE — Progress Notes (Signed)
Follow-Up Visit   Subjective  Mackenzie Dean is a 72 y.o. female who presents for the following: Skin Cancer Screening and Full Body Skin Exam, hx of BCC  The patient presents for Total-Body Skin Exam (TBSE) for skin cancer screening and mole check. The patient has spots, moles and lesions to be evaluated, some may be new or changing and the patient may have concern these could be cancer.    The following portions of the chart were reviewed this encounter and updated as appropriate: medications, allergies, medical history  Review of Systems:  No other skin or systemic complaints except as noted in HPI or Assessment and Plan.  Objective  Well appearing patient in no apparent distress; mood and affect are within normal limits.  A full examination was performed including scalp, head, eyes, ears, nose, lips, neck, chest, axillae, abdomen, back, buttocks, bilateral upper extremities, bilateral lower extremities, hands, feet, fingers, toes, fingernails, and toenails. All findings within normal limits unless otherwise noted below.   Relevant physical exam findings are noted in the Assessment and Plan.  left forearm x 1 Stuck-on, waxy, tan-brown papule  --Discussed benign etiology and prognosis.     Assessment & Plan   SKIN CANCER SCREENING PERFORMED TODAY.  ACTINIC DAMAGE - Chronic condition, secondary to cumulative UV/sun exposure - diffuse scaly erythematous macules with underlying dyspigmentation - Recommend daily broad spectrum sunscreen SPF 30+ to sun-exposed areas, reapply every 2 hours as needed.  - Staying in the shade or wearing long sleeves, sun glasses (UVA+UVB protection) and wide brim hats (4-inch brim around the entire circumference of the hat) are also recommended for sun protection.  - Call for new or changing lesions.  LENTIGINES, SEBORRHEIC KERATOSES, HEMANGIOMAS - Benign normal skin lesions - Benign-appearing - Call for any changes  MELANOCYTIC NEVI -  Tan-brown and/or pink-flesh-colored symmetric macules and papules - Benign appearing on exam today - Observation - Call clinic for new or changing moles - Recommend daily use of broad spectrum spf 30+ sunscreen to sun-exposed areas.   HEMANGIOMA 0.2 cm red macule at the left of midline forehead at hairline  Discussed benign nature. Recommend observation. Call for changes.    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  - Call if any new or changing lesions are noted between office visits   Hx of breast cancer Left Breast - No lymphadenopathy - Recommend regular full body skin exams - Recommend daily broad spectrum sunscreen SPF 30+ to sun-exposed areas, reapply every 2 hours as needed.  - Call if any new or changing lesions are noted between office visits  Other acne Face Chronic and persistent condition with duration or expected duration over one year. Condition is symptomatic / bothersome to patient. Not to goal. Acne/Milia  Re Start Tretinoin  0.25% cream apply at bedtime Use a facial moisturizer daily or at bedtime with Tretinoin cream   Inflamed seborrheic keratosis left forearm x 1  Symptomatic, irritating, patient would like treated.   Destruction of lesion - left forearm x 1 Complexity: simple   Destruction method: cryotherapy   Informed consent: discussed and consent obtained   Timeout:  patient name, date of birth, surgical site, and procedure verified Lesion destroyed using liquid nitrogen: Yes   Region frozen until ice ball extended beyond lesion:  Yes   Outcome: patient tolerated procedure well with no complications   Post-procedure details: wound care instructions given     Return in about 1 year (around 08/03/2024) for TBSE, hx of BCC .  IAngelique Holm, CMA, am acting as scribe for Armida Sans, MD .   Documentation: I have reviewed the above documentation for accuracy and completeness, and I agree with the above.  Armida Sans, MD

## 2023-08-06 ENCOUNTER — Encounter: Payer: Self-pay | Admitting: Dermatology

## 2023-08-13 DIAGNOSIS — I700 Atherosclerosis of aorta: Secondary | ICD-10-CM | POA: Diagnosis not present

## 2023-08-13 DIAGNOSIS — E785 Hyperlipidemia, unspecified: Secondary | ICD-10-CM | POA: Diagnosis not present

## 2023-08-13 DIAGNOSIS — J309 Allergic rhinitis, unspecified: Secondary | ICD-10-CM | POA: Diagnosis not present

## 2023-08-13 DIAGNOSIS — M810 Age-related osteoporosis without current pathological fracture: Secondary | ICD-10-CM | POA: Diagnosis not present

## 2023-08-13 DIAGNOSIS — N952 Postmenopausal atrophic vaginitis: Secondary | ICD-10-CM | POA: Diagnosis not present

## 2023-08-13 DIAGNOSIS — I890 Lymphedema, not elsewhere classified: Secondary | ICD-10-CM | POA: Diagnosis not present

## 2023-08-13 DIAGNOSIS — Z853 Personal history of malignant neoplasm of breast: Secondary | ICD-10-CM | POA: Diagnosis not present

## 2023-08-13 DIAGNOSIS — M1990 Unspecified osteoarthritis, unspecified site: Secondary | ICD-10-CM | POA: Diagnosis not present

## 2023-08-16 ENCOUNTER — Other Ambulatory Visit: Payer: Self-pay

## 2023-09-02 ENCOUNTER — Encounter: Payer: Self-pay | Admitting: Family Medicine

## 2023-09-02 ENCOUNTER — Ambulatory Visit: Payer: PPO | Admitting: Family Medicine

## 2023-09-02 VITALS — BP 122/78 | Ht 63.5 in | Wt 113.0 lb

## 2023-09-02 DIAGNOSIS — M7740 Metatarsalgia, unspecified foot: Secondary | ICD-10-CM

## 2023-09-02 NOTE — Patient Instructions (Signed)
You have metatarsalgia and transverse arch collapse. Use the toe spacers as much as possible. Metatarsal pads (or cookies) will help prevent this from progressing and support that arch. These usually need to be replaced every 6 months - we can show you how to order them if they work well for you. Otherwise follow up with me as needed.

## 2023-09-02 NOTE — Progress Notes (Signed)
PCP: Sherlene Shams, MD  Subjective:   HPI: Patient is a 72 y.o. female here for toe and foot issues.  Patient reports she spends a lot of time on her feet. Worked as a Runner, broadcasting/film/video and now still gets 10K steps per day most days of the week. No injury or trauma. Started to notice right 2nd digit coming up over the great toe on this side. Not much discomfort. Has not tried anything for this.  Past Medical History:  Diagnosis Date   Allergy    Basal cell carcinoma 02/26/2015   L lat breast - excision   Breast cancer (HCC) 2001   chemo and radiation   Chicken pox    Heart murmur    [employer]   Hyperlipidemia    Personal history of chemotherapy 2001   left breast ca   Personal history of radiation therapy 2001   left breast ca    Current Outpatient Medications on File Prior to Visit  Medication Sig Dispense Refill   alendronate (FOSAMAX) 70 MG tablet Take 1 tablet (70 mg total) by mouth every 7 (seven) days Take with a full glass of water. Do not lie down for the next 30 min. 4 tablet 1   Calcium Carbonate (CALCIUM 500 PO) Take by mouth daily in the afternoon.     cetirizine (ZYRTEC) 10 MG tablet Take 10 mg by mouth daily.     estradiol (ESTRACE VAGINAL) 0.1 MG/GM vaginal cream PLACE 1 APPLICATORFUL VAGINALLY 2 TIMES A WEEK 127.5 g 3   Multiple Vitamins-Minerals (MULTIVITAMIN PO) Take 1 capsule by mouth daily.     pneumococcal 20-valent conjugate vaccine (PREVNAR 20) 0.5 ML injection Inject into the muscle. 0.5 mL 0   rosuvastatin (CRESTOR) 10 MG tablet Take 1 tablet (10 mg total) by mouth every other day. 90 tablet 3   tretinoin (RETIN-A) 0.025 % cream Apply 1 Application topically at bedtime. 45 g 11   No current facility-administered medications on file prior to visit.    Past Surgical History:  Procedure Laterality Date   BREAST BIOPSY Right 2001   negative   BREAST BIOPSY Left 2001   breast cancer invasive stage 1 ER +   BREAST LUMPECTOMY Left 2001   invasive mammary  carcinoma   BREAST SURGERY Left 2002   COLONOSCOPY WITH PROPOFOL N/A 01/14/2023   Procedure: COLONOSCOPY WITH PROPOFOL;  Surgeon: Toney Reil, MD;  Location[facility] ENDOSCOPY;  Service: Gastroenterology;  Laterality: N/A;   DILATION AND CURETTAGE OF UTERUS      Allergies  Allergen Reactions   Augmentin [Amoxicillin-Pot Clavulanate] Diarrhea    BP 122/78   Ht 5' 3.5" (1.613 m)   Wt 113 lb (51.3 kg)   BMI 19.70 kg/m       No data to display              No data to display              Objective:  Physical Exam:  Gen: NAD, comfortable in exam room  Bilateral feet: Transverse arch collapse bilaterally.  Callus under 2nd-4th digit metatarsal heads.  Mild hallux valgus.  No swelling, ecchymoses. Full range of motion No tenderness to palpation Negative ant drawer and negative talar tilt.   NV intact distally.   Assessment & Plan:  1. Bilateral foot pain - not much discomfort but notable metatarsalgia as main issue.  Some hallux valgus on right - add toe spacers and metatarsal pads.  Tylenol, ibuprofen, icing if needed.  F/u prn.

## 2023-09-10 ENCOUNTER — Other Ambulatory Visit: Payer: Self-pay

## 2023-09-10 MED ORDER — COMIRNATY 30 MCG/0.3ML IM SUSY
0.3000 mL | PREFILLED_SYRINGE | Freq: Once | INTRAMUSCULAR | 0 refills | Status: AC
  Filled 2023-09-10: qty 0.3, 1d supply, fill #0

## 2023-09-10 MED ORDER — FLUAD 0.5 ML IM SUSY
0.5000 mL | PREFILLED_SYRINGE | Freq: Once | INTRAMUSCULAR | 0 refills | Status: AC
  Filled 2023-09-10: qty 0.5, 1d supply, fill #0

## 2023-09-28 ENCOUNTER — Other Ambulatory Visit: Payer: Self-pay | Admitting: Internal Medicine

## 2023-09-28 ENCOUNTER — Other Ambulatory Visit: Payer: Self-pay

## 2023-09-29 ENCOUNTER — Other Ambulatory Visit: Payer: Self-pay

## 2023-09-30 ENCOUNTER — Other Ambulatory Visit: Payer: Self-pay

## 2023-10-01 ENCOUNTER — Other Ambulatory Visit: Payer: Self-pay

## 2023-10-06 ENCOUNTER — Other Ambulatory Visit: Payer: Self-pay

## 2023-10-07 ENCOUNTER — Other Ambulatory Visit: Payer: Self-pay

## 2023-10-11 ENCOUNTER — Other Ambulatory Visit: Payer: Self-pay

## 2023-10-11 ENCOUNTER — Encounter: Payer: Self-pay | Admitting: Internal Medicine

## 2023-10-11 MED FILL — Alendronate Sodium Tab 70 MG: ORAL | 28 days supply | Qty: 4 | Fill #0 | Status: CN

## 2023-10-12 ENCOUNTER — Other Ambulatory Visit: Payer: Self-pay

## 2023-10-12 MED ORDER — ALENDRONATE SODIUM 70 MG PO TABS
70.0000 mg | ORAL_TABLET | ORAL | 1 refills | Status: DC
  Filled 2023-10-12: qty 4, 28d supply, fill #0
  Filled 2023-11-04: qty 4, 28d supply, fill #1

## 2023-10-13 ENCOUNTER — Other Ambulatory Visit: Payer: Self-pay

## 2023-11-04 ENCOUNTER — Other Ambulatory Visit: Payer: Self-pay

## 2023-11-16 DIAGNOSIS — H353131 Nonexudative age-related macular degeneration, bilateral, early dry stage: Secondary | ICD-10-CM | POA: Diagnosis not present

## 2023-11-16 DIAGNOSIS — H25013 Cortical age-related cataract, bilateral: Secondary | ICD-10-CM | POA: Diagnosis not present

## 2023-11-16 DIAGNOSIS — H43812 Vitreous degeneration, left eye: Secondary | ICD-10-CM | POA: Diagnosis not present

## 2023-11-16 DIAGNOSIS — H5203 Hypermetropia, bilateral: Secondary | ICD-10-CM | POA: Diagnosis not present

## 2023-11-22 ENCOUNTER — Encounter: Payer: PPO | Admitting: Internal Medicine

## 2023-11-22 IMAGING — DX DG CERVICAL SPINE COMPLETE 4+V
6 series · 6 of 6 positions shown · non-contrast
Comparison: None Available.

CLINICAL DATA: Posterior neck pain for several weeks, no known
injury.

EXAM:
CERVICAL SPINE - COMPLETE 4+ VIEW

[cervical spine ap]
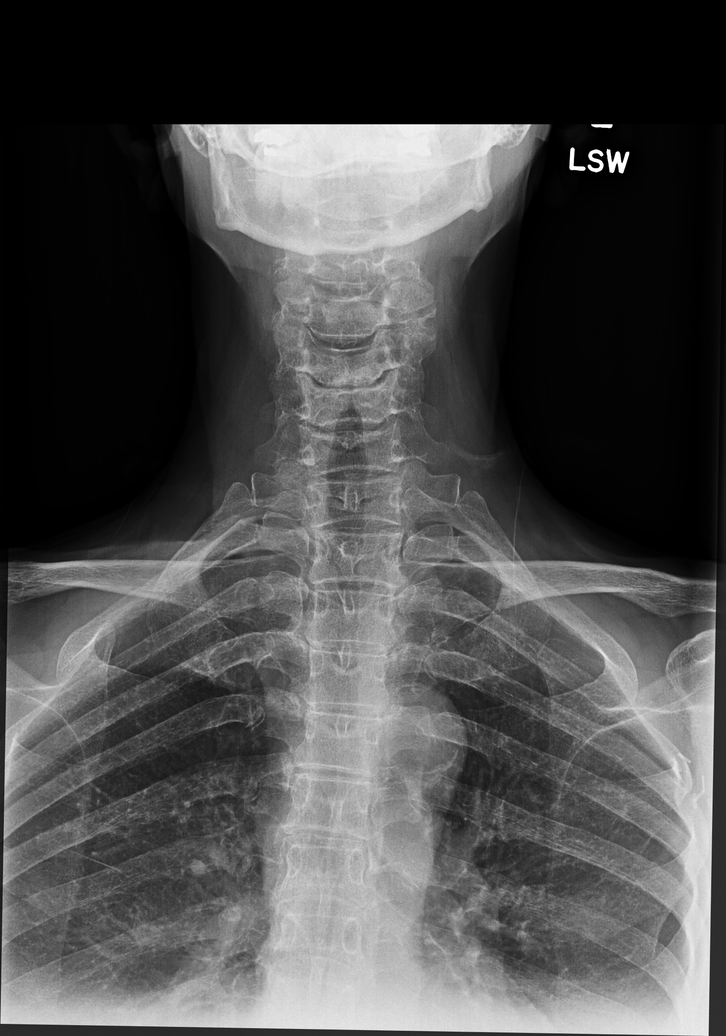

[cervical spine oblique (1 of 2)]
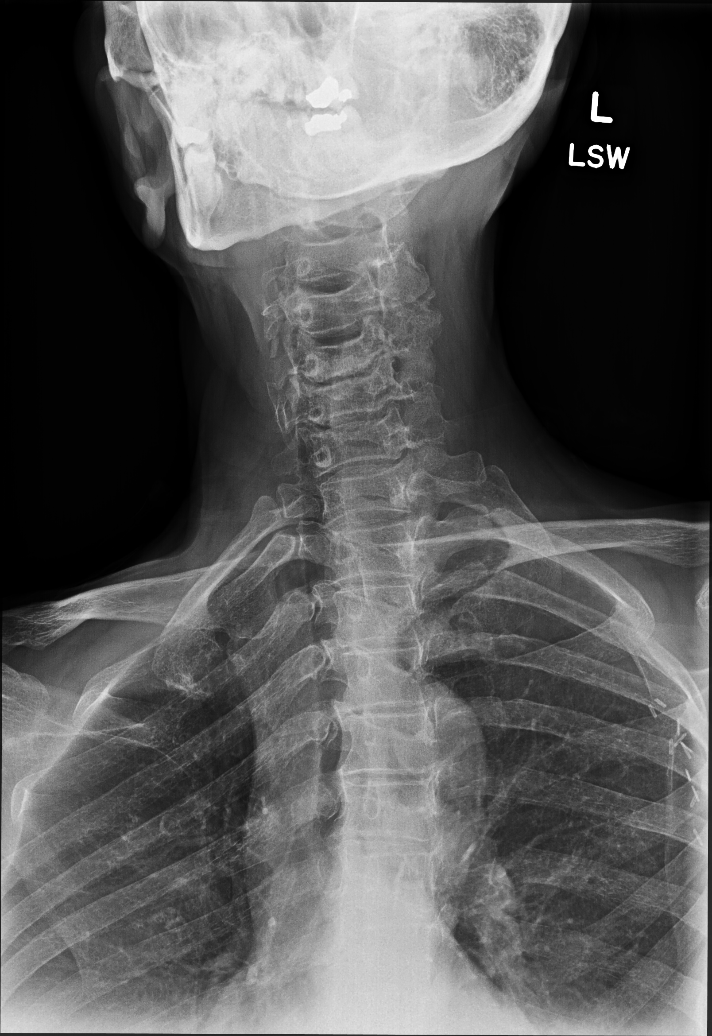

[cervical spine oblique (2 of 2)]
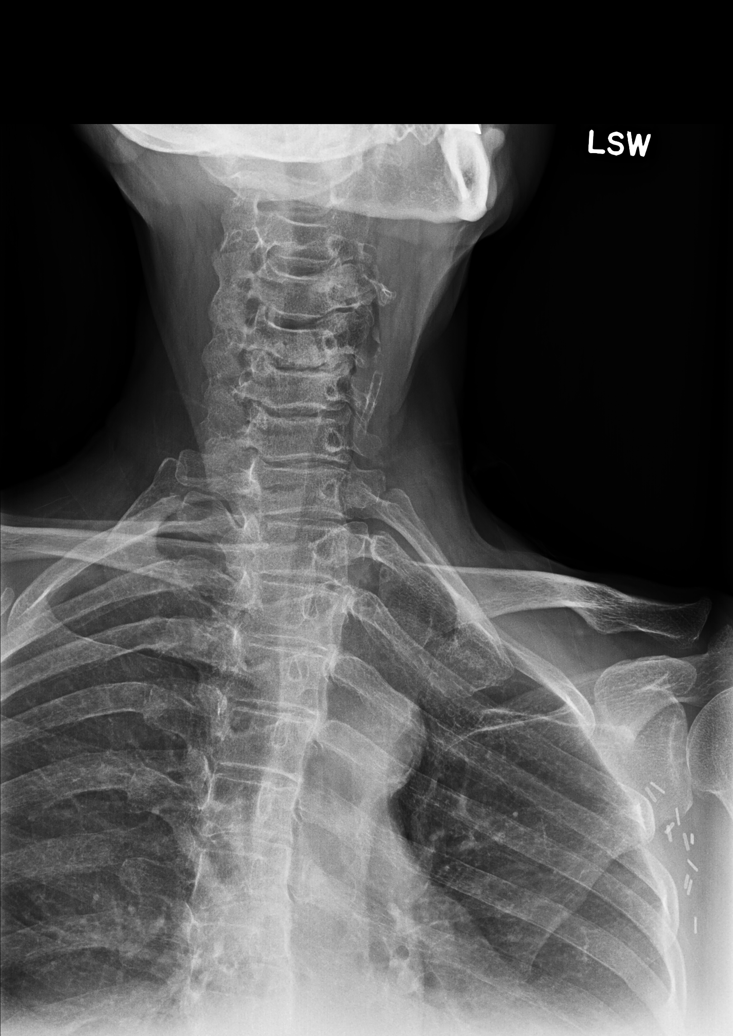

[cervical spine lat]
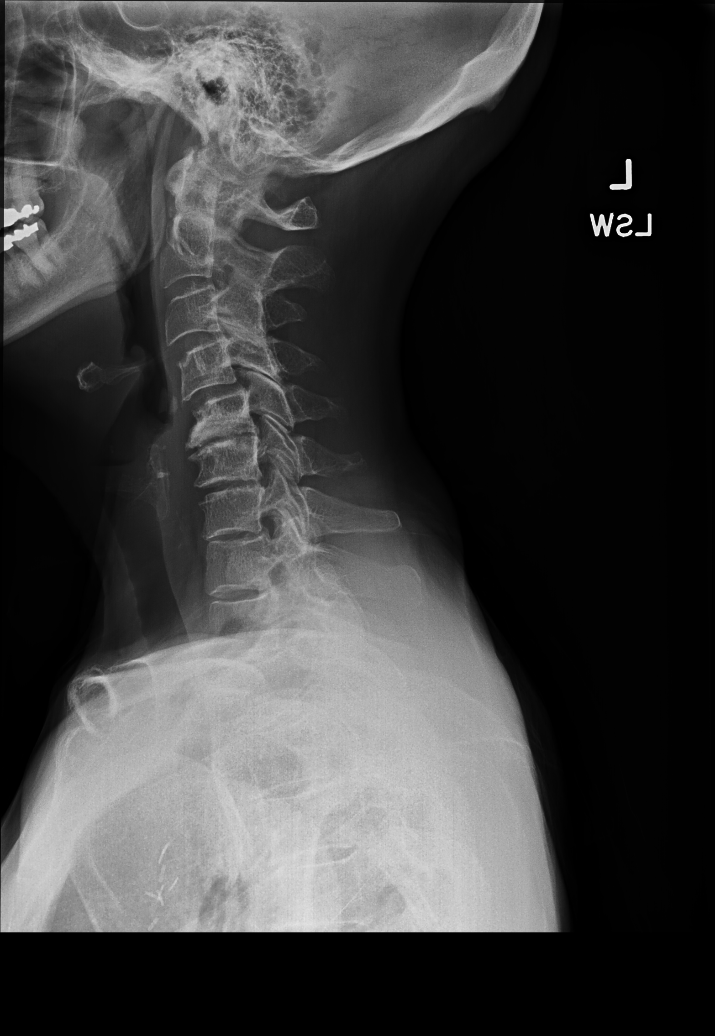

[swimmers lat]
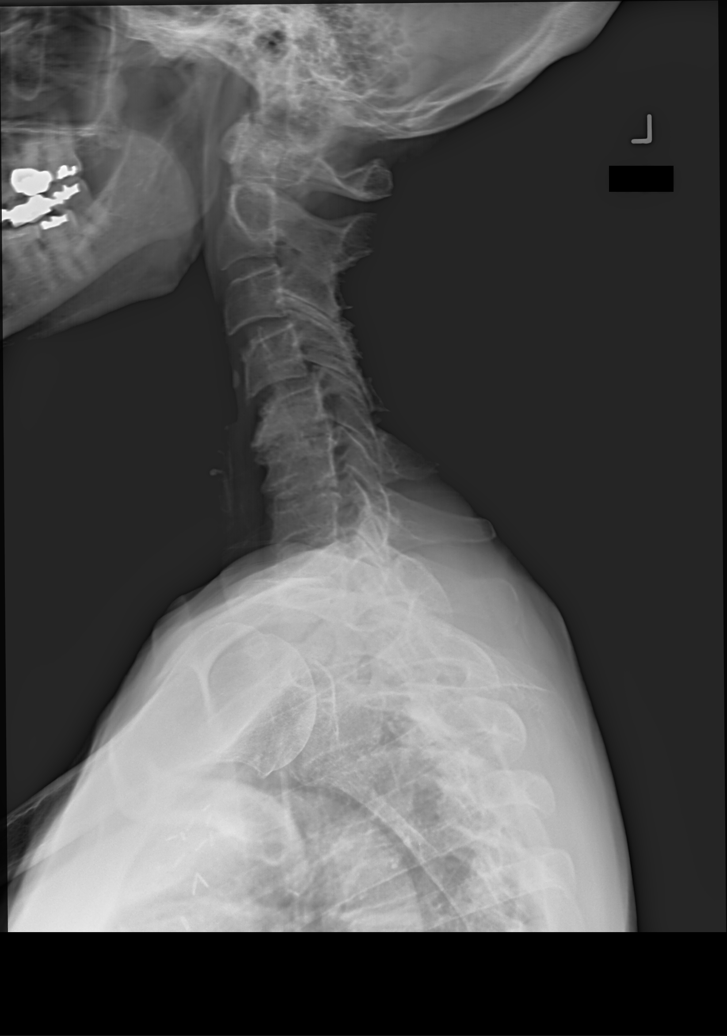

[cervical spine open mouth ap]
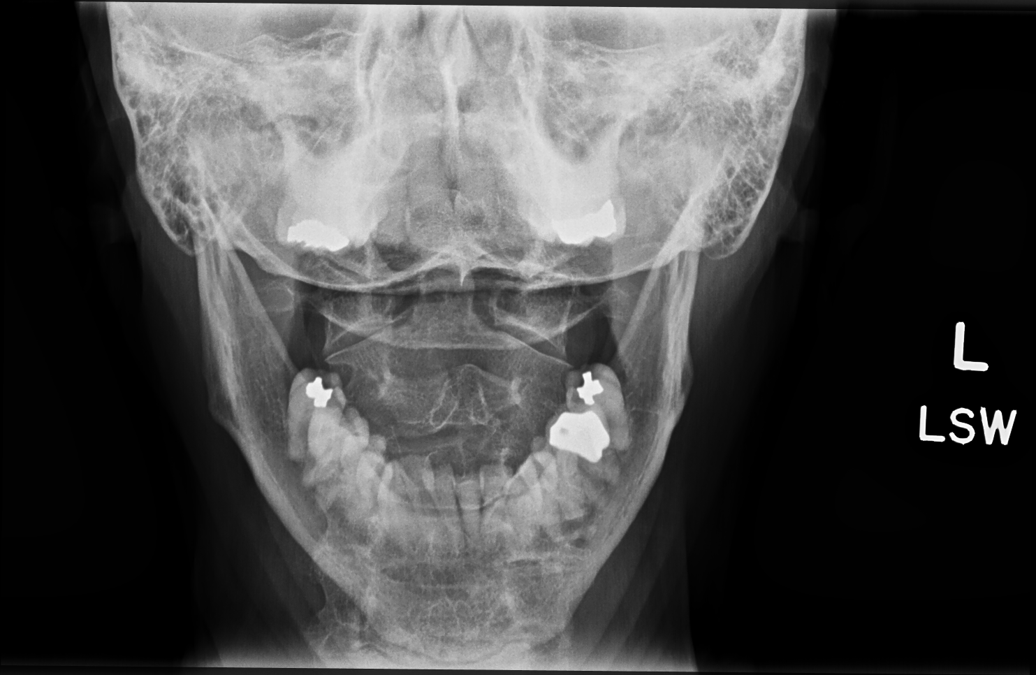

[6 of 6 positions shown; findings below may reference images not displayed]

FINDINGS: Seven cervical segments are well visualized. Mild loss of the normal
cervical lordosis is noted. Osteophytic changes are noted at C5-6
and C6-7. Mild facet hypertrophic changes are noted with
degenerative anterolisthesis of C4 on C5. The odontoid is within
normal limits. No soft tissue abnormality is seen.
IMPRESSION: Multilevel degenerative change without acute abnormality.

## 2023-12-13 ENCOUNTER — Encounter: Payer: PPO | Admitting: Internal Medicine

## 2023-12-22 ENCOUNTER — Encounter: Payer: Self-pay | Admitting: Internal Medicine

## 2023-12-22 ENCOUNTER — Ambulatory Visit: Payer: PPO | Admitting: Internal Medicine

## 2023-12-22 ENCOUNTER — Other Ambulatory Visit: Payer: Self-pay

## 2023-12-22 VITALS — BP 120/74 | HR 87 | Ht 63.5 in | Wt 119.1 lb

## 2023-12-22 DIAGNOSIS — M810 Age-related osteoporosis without current pathological fracture: Secondary | ICD-10-CM

## 2023-12-22 DIAGNOSIS — Z1231 Encounter for screening mammogram for malignant neoplasm of breast: Secondary | ICD-10-CM

## 2023-12-22 DIAGNOSIS — E876 Hypokalemia: Secondary | ICD-10-CM

## 2023-12-22 DIAGNOSIS — Z0000 Encounter for general adult medical examination without abnormal findings: Secondary | ICD-10-CM | POA: Diagnosis not present

## 2023-12-22 DIAGNOSIS — E782 Mixed hyperlipidemia: Secondary | ICD-10-CM

## 2023-12-22 DIAGNOSIS — E559 Vitamin D deficiency, unspecified: Secondary | ICD-10-CM | POA: Diagnosis not present

## 2023-12-22 DIAGNOSIS — I700 Atherosclerosis of aorta: Secondary | ICD-10-CM

## 2023-12-22 DIAGNOSIS — Z79899 Other long term (current) drug therapy: Secondary | ICD-10-CM

## 2023-12-22 DIAGNOSIS — R7301 Impaired fasting glucose: Secondary | ICD-10-CM | POA: Diagnosis not present

## 2023-12-22 DIAGNOSIS — Z818 Family history of other mental and behavioral disorders: Secondary | ICD-10-CM | POA: Diagnosis not present

## 2023-12-22 DIAGNOSIS — Z853 Personal history of malignant neoplasm of breast: Secondary | ICD-10-CM

## 2023-12-22 LAB — LIPID PANEL
Cholesterol: 160 mg/dL (ref 0–200)
HDL: 58 mg/dL (ref >39.00)
LDL Cholesterol: 75 mg/dL (ref 0–99)
NonHDL: 101.86
Total CHOL/HDL Ratio: 3
Triglycerides: 136 mg/dL (ref 0.0–149.0)
VLDL: 27.2 mg/dL (ref 0.0–40.0)

## 2023-12-22 LAB — CBC WITH DIFFERENTIAL/PLATELET
Basophils Absolute: 0.1 10*3/uL (ref 0.0–0.1)
Basophils Relative: 2.5 % (ref 0.0–3.0)
Eosinophils Absolute: 0.2 10*3/uL (ref 0.0–0.7)
Eosinophils Relative: 4.3 % (ref 0.0–5.0)
HCT: 42.5 % (ref 36.0–46.0)
Hemoglobin: 14.5 g/dL (ref 12.0–15.0)
Lymphocytes Relative: 38.7 % (ref 12.0–46.0)
Lymphs Abs: 1.9 10*3/uL (ref 0.7–4.0)
MCHC: 34.1 g/dL (ref 30.0–36.0)
MCV: 92.1 fL (ref 78.0–100.0)
Monocytes Absolute: 0.4 10*3/uL (ref 0.1–1.0)
Monocytes Relative: 8.3 % (ref 3.0–12.0)
Neutro Abs: 2.3 10*3/uL (ref 1.4–7.7)
Neutrophils Relative %: 46.2 % (ref 43.0–77.0)
Platelets: 244 10*3/uL (ref 150.0–400.0)
RBC: 4.62 Mil/uL (ref 3.87–5.11)
RDW: 12.6 % (ref 11.5–15.5)
WBC: 4.9 10*3/uL (ref 4.0–10.5)

## 2023-12-22 LAB — COMPREHENSIVE METABOLIC PANEL
ALT: 18 U/L (ref 0–35)
AST: 25 U/L (ref 0–37)
Albumin: 4.4 g/dL (ref 3.5–5.2)
Alkaline Phosphatase: 40 U/L (ref 39–117)
BUN: 18 mg/dL (ref 6–23)
CO2: 29 meq/L (ref 19–32)
Calcium: 9.4 mg/dL (ref 8.4–10.5)
Chloride: 100 meq/L (ref 96–112)
Creatinine, Ser: 0.85 mg/dL (ref 0.40–1.20)
GFR: 69.54 mL/min (ref >60.00)
Glucose, Bld: 78 mg/dL (ref 70–99)
Potassium: 3.2 meq/L — ABNORMAL LOW (ref 3.5–5.1)
Sodium: 138 meq/L (ref 135–145)
Total Bilirubin: 0.7 mg/dL (ref 0.2–1.2)
Total Protein: 7.5 g/dL (ref 6.0–8.3)

## 2023-12-22 LAB — TSH: TSH: 1.18 u[IU]/mL (ref 0.35–5.50)

## 2023-12-22 LAB — VITAMIN D 25 HYDROXY (VIT D DEFICIENCY, FRACTURES): VITD: 45.33 ng/mL (ref 30.00–100.00)

## 2023-12-22 LAB — LDL CHOLESTEROL, DIRECT: Direct LDL: 90 mg/dL

## 2023-12-22 MED ORDER — ALENDRONATE SODIUM 70 MG PO TABS
70.0000 mg | ORAL_TABLET | ORAL | 3 refills | Status: DC
  Filled 2023-12-22: qty 12, 84d supply, fill #0
  Filled 2024-03-09 – 2024-03-21 (×2): qty 12, 84d supply, fill #1
  Filled 2024-06-19 – 2024-06-30 (×2): qty 12, 84d supply, fill #2
  Filled 2024-09-11: qty 12, 84d supply, fill #3

## 2023-12-22 NOTE — Progress Notes (Signed)
 Patient ID: Mackenzie Dean, female    DOB: 15-Apr-1951  Age: 73 y.o. MRN: [id number]  The patient is here for annual PREVENTIVE examination and management of other chronic and acute problems.   The risk factors are reflected in the social history.  The roster of all physicians providing medical care to patient - is listed in the Snapshot section of the chart.  Activities of daily living:  The patient is 100% independent in all ADLs: dressing, toileting, feeding as well as independent mobility  Home safety : The patient has smoke detectors in the home. They wear seatbelts.  There are no firearms at home. There is no violence in the home.   There is no risks for hepatitis, STDs or HIV. There is no   history of blood transfusion. They have no travel history to infectious disease endemic areas of the world.  The patient has seen their dentist in the last six month. They have seen their eye doctor in the last year. They admit to slight hearing difficulty with regard to whispered voices and some television programs.  They have deferred audiologic testing in the last year.  They do not  have excessive sun exposure. Discussed the need for sun protection: hats, long sleeves and use of sunscreen if there is significant sun exposure.   Diet: the importance of a healthy diet is discussed. They do have a healthy diet.  The benefits of regular aerobic exercise were discussed. She  no longer exercises at a gym , but exercises at home by walking daily for 30 minutes .   Depression screen: there are no signs or vegative symptoms of depression- irritability, change in appetite, anhedonia, sadness/tearfullness.  Cognitive assessment: the patient manages all their financial and personal affairs and is actively engaged. They could relate day,date,year and events; recalled 2/3 objects at 3 minutes; performed clock-face test normally.  She has been enrolled in Dr Jamal brain health program   The following portions  of the patient's history were reviewed and updated as appropriate: allergies, current medications, past family history, past medical history,  past surgical history, past social history  and problem list.  Visual acuity was not assessed per patient preference since she has regular follow up with her ophthalmologist. Hearing and body mass index were assessed and reviewed.   During the course of the visit the patient was educated and counseled about appropriate screening and preventive services including : fall prevention , diabetes screening, nutrition counseling, colorectal cancer screening, and recommended immunizations.    CC: The primary encounter diagnosis was Encounter for screening mammogram for malignant neoplasm of breast. Diagnoses of Visit for preventive health examination, Mixed hyperlipidemia, Long-term use of high-risk medication, Impaired fasting glucose, History of breast cancer in female, Age-related osteoporosis without current pathological fracture, Family history of dementia, Vitamin D  deficiency, History of breast cancer, and Thoracic aortic atherosclerosis (HCC) were also pertinent to this visit.  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   She has a past surgical history that includes Breast surgery (Left, 2002); Dilation and curettage of uterus; Breast lumpectomy (Left, 2001); Breast biopsy (Right, 2001); Breast biopsy (Left, 2001); and Colonoscopy with propofol  (  N/A, 01/14/2023).   Her family history includes Arrhythmia in her mother; Breast cancer in her cousin and cousin; Dementia in her mother; Heart disease in her  father and mother; Heart failure in her paternal aunt; Mental retardation in her mother; Stroke in her father.She reports that she has never smoked. She has been exposed to tobacco smoke. She has never used smokeless tobacco. She reports current alcohol use of about 1.0 standard drink of alcohol per week. She reports that she does not use drugs.  Outpatient Medications Prior to Visit  Medication Sig Dispense Refill   Calcium  Carbonate (CALCIUM  500 PO) Take by mouth daily in the afternoon.     cetirizine (ZYRTEC) 10 MG tablet Take 10 mg by mouth daily.     estradiol  (ESTRACE  VAGINAL) 0.1 MG/GM vaginal cream PLACE 1 APPLICATORFUL VAGINALLY 2 TIMES A WEEK 127.5 g 3   Multiple Vitamins-Minerals (MULTIVITAMIN PO) Take 1 capsule by mouth daily.     rosuvastatin  (CRESTOR ) 10 MG tablet Take 1 tablet (10 mg total) by mouth every other day. 90 tablet 3   tretinoin  (RETIN-A ) 0.025 % cream Apply 1 Application topically at bedtime. 45 g 11   alendronate  (FOSAMAX ) 70 MG tablet Take 1 tablet (70 mg total) by mouth every 7 (seven) days Take with a full glass of water. Do not lie down for the next 30 min. 4 tablet 1   pneumococcal 20-valent conjugate vaccine (PREVNAR 20 ) 0.5 ML injection Inject into the muscle. 0.5 mL 0   No facility-administered medications prior to visit.    Review of Systems  Patient denies headache, fevers, malaise, unintentional weight loss, skin rash, eye pain, sinus congestion and sinus pain, sore throat, dysphagia,  hemoptysis , cough, dyspnea, wheezing, chest pain, palpitations, orthopnea, edema, abdominal pain, nausea, melena, diarrhea, constipation, flank pain, dysuria, hematuria, urinary  Frequency, nocturia, numbness, tingling, seizures,  Focal weakness, Loss of consciousness,  Tremor, insomnia, depression, anxiety, and suicidal ideation.     Objective:  BP 120/74   Pulse 87   Ht 5' 3.5 (1.613 m)   Wt 119 lb 1.9 oz (54 kg)   SpO2 98%   BMI 20.77 kg/m   Physical  Exam Vitals reviewed.  Constitutional:      General: She is not in acute distress.    Appearance: Normal appearance. She is well-developed and normal weight. She is not ill-appearing, toxic-appearing or diaphoretic.  HENT:     Head: Normocephalic.     Right Ear: Tympanic membrane, ear canal and external ear normal. There is no impacted cerumen.     Left Ear: Tympanic membrane, ear canal and external ear normal. There is no impacted cerumen.     Nose: Nose normal.     Mouth/Throat:     Mouth: Mucous membranes are moist.     Pharynx: Oropharynx is clear.  Eyes:     General: No scleral icterus.       Right eye: No discharge.        Left eye: No discharge.     Conjunctiva/sclera: Conjunctivae normal.     Pupils: Pupils are equal, round, and reactive to light.  Neck:     Thyroid : No thyromegaly.     Vascular: No carotid bruit or JVD.  Cardiovascular:     Rate and Rhythm: Normal rate and regular rhythm.     Heart sounds: Normal heart sounds.  Pulmonary:     Effort: Pulmonary effort is normal. No respiratory distress.     Breath sounds: Normal breath  sounds.  Chest:  Breasts:    Breasts are symmetrical.     Right: Normal. No swelling, inverted nipple, mass, nipple discharge, skin change or tenderness.     Left: Normal. No swelling, inverted nipple, mass, nipple discharge, skin change or tenderness.       Comments: Surgical scar  Abdominal:     General: Bowel sounds are normal.     Palpations: Abdomen is soft. There is no mass.     Tenderness: There is no abdominal tenderness. There is no guarding or rebound.  Musculoskeletal:        General: Normal range of motion.     Cervical back: Normal range of motion and neck supple.  Lymphadenopathy:     Cervical: No cervical adenopathy.     Upper Body:     Right upper body: No supraclavicular, axillary or pectoral adenopathy.     Left upper body: No supraclavicular, axillary or pectoral adenopathy.  Skin:    General: Skin is warm and  dry.  Neurological:     General: No focal deficit present.     Mental Status: She is alert and oriented to person, place, and time. Mental status is at baseline.  Psychiatric:        Mood and Affect: Mood normal.        Behavior: Behavior normal.        Thought Content: Thought content normal.        Judgment: Judgment normal.       Assessment & Plan:  Encounter for screening mammogram for malignant neoplasm of breast -     3D Screening Mammogram, Left and Right; Future  Visit for preventive health examination  Mixed hyperlipidemia -     Lipid panel -     LDL cholesterol, direct -     Comprehensive metabolic panel; Future  Long-term use of high-risk medication -     CBC with Differential/Platelet -     TSH  Impaired fasting glucose -     Comprehensive metabolic panel  History of breast cancer in female Assessment & Plan: CONTINUE ANNUAL SCREENING ALTERNATING MAMMOGRAM WITH MRI BREAST .  LAST mri BREAST WAS jULY 2022,  LAST MAMMOGRAM sEPT 2023.    Orders: -     MR BREAST W & WO CM SCREENING (GI); Future  Age-related osteoporosis without current pathological fracture Assessment & Plan: Managed initially WITH  [facility]  , started in 2007,  chosen because of her history of BRCA.  However by  DEXA in  Dec 2021 she experienced significant loss of density in the spine (T score dropped from  -1.9 in 2016 to -2.6 ) but nowhere else..  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 Repeat DEXA  in 2024 notes significant improvement in T scores.     Family history of dementia Assessment & Plan: Was referred to [facility] in April 2024:  03/08/2023  SLUMS 21/30  Had  MRI Brain without contrast with NeuroQuant Sequence and was refer for neuropsychological testing Mackenzie Dean should have a neuropsychological evaluation for further defining and quantification of cognitive impairment.    Vitamin D   deficiency -     VITAMIN D  25 Hydroxy (Vit-D Deficiency, Fractures)  History of breast cancer Assessment & Plan: She has had no recurrence since 2002 treatment s/p partial mastectomy,  followed by tamoxifen. . Continue surveillance with annual imaging, alternating MRI with mammogram    Thoracic aortic  atherosclerosis [facility]) Assessment & Plan: Noted on CT chest / coronary CT;   calcium  score was zero .SABRA  She has been tolerating  rosuvastatin  10 mg every other day since lMay 2022    Other orders -     Alendronate  Sodium; Take 1 tablet (70 mg total) by mouth every 7 (seven) days Take with a full glass of water. Do not lie down for the next 30 min.  Dispense: 12 tablet; Refill: 3      I provided 40 minutes of  face-to-face time during this encounter reviewing patient's current problems and past surgeries,  recent labs and imaging studies, providing counseling on the above mentioned problems , and coordination  of care .   Follow-up: Return in about 6 months (around 06/20/2024).   Mackenzie LITTIE Kettering, MD

## 2023-12-22 NOTE — Patient Instructions (Addendum)
 I highly recommend reading "the End of Alzheimer's: The First Program to Prevent and Reverse Cognitive Decline"  by Fonnie Iba, MD    MRI Breast has been ordered (to be done at [facility]).  Notify me if you are not contacted by end of next week

## 2023-12-22 NOTE — Assessment & Plan Note (Addendum)
 CONTINUE ANNUAL SCREENING ALTERNATING MAMMOGRAM WITH MRI BREAST .  LAST mri BREAST WAS jULY 2022,  LAST MAMMOGRAM sEPT 2023.

## 2023-12-22 NOTE — Assessment & Plan Note (Addendum)
She has had no recurrence since 2002 treatment s/p partial mastectomy,  followed by tamoxifen. . Continue surveillance with annual imaging, alternating MRI with mammogram

## 2023-12-22 NOTE — Assessment & Plan Note (Addendum)
 Managed initially WITH  [facility]  , started in 2007,  chosen because of her history of BRCA.  However by  DEXA in  Dec 2021 she experienced significant loss of density in the spine (T score dropped from  -1.9 in 2016 to -2.6 ) but nowhere else..  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 Repeat DEXA  in 2024 notes significant improvement in T scores.

## 2023-12-22 NOTE — Assessment & Plan Note (Signed)
 Was referred to [facility] in April 2024:  03/08/2023  SLUMS 21/30  Had  MRI Brain without contrast with NeuroQuant Sequence and was refer for neuropsychological testing Mackenzie Dean should have a neuropsychological evaluation for further defining and quantification of cognitive impairment.

## 2023-12-22 NOTE — Assessment & Plan Note (Signed)
 Noted on CT chest / coronary CT;   calcium  score was zero .Aaron Aas  She has been tolerating  rosuvastatin  10 mg every other day since lMay 2022

## 2023-12-23 ENCOUNTER — Encounter: Payer: Self-pay | Admitting: Internal Medicine

## 2023-12-23 NOTE — Addendum Note (Signed)
 Addended by: Thersia Flax on: 12/23/2023 09:46 PM   Modules accepted: Orders

## 2023-12-25 ENCOUNTER — Other Ambulatory Visit: Payer: Self-pay | Admitting: Internal Medicine

## 2023-12-25 MED ORDER — POTASSIUM CHLORIDE CRYS ER 20 MEQ PO TBCR
20.0000 meq | EXTENDED_RELEASE_TABLET | Freq: Every day | ORAL | 0 refills | Status: AC
  Filled 2023-12-25: qty 5, 5d supply, fill #0

## 2023-12-26 ENCOUNTER — Other Ambulatory Visit: Payer: Self-pay

## 2023-12-27 ENCOUNTER — Other Ambulatory Visit: Payer: Self-pay

## 2023-12-27 MED ORDER — BOOSTRIX 5-2.5-18.5 LF-MCG/0.5 IM SUSY
0.5000 mL | PREFILLED_SYRINGE | Freq: Once | INTRAMUSCULAR | 0 refills | Status: AC
  Filled 2023-12-27: qty 0.5, 1d supply, fill #0

## 2023-12-28 ENCOUNTER — Other Ambulatory Visit: Payer: Self-pay

## 2023-12-29 ENCOUNTER — Other Ambulatory Visit: Payer: Self-pay | Admitting: Internal Medicine

## 2023-12-29 ENCOUNTER — Other Ambulatory Visit: Payer: Self-pay

## 2023-12-29 MED ORDER — ROSUVASTATIN CALCIUM 10 MG PO TABS
10.0000 mg | ORAL_TABLET | ORAL | 3 refills | Status: AC
  Filled 2023-12-29: qty 45, 90d supply, fill #0
  Filled 2024-03-09 – 2024-03-24 (×2): qty 45, 90d supply, fill #1
  Filled 2024-06-19: qty 45, 90d supply, fill #2

## 2024-01-10 ENCOUNTER — Other Ambulatory Visit (INDEPENDENT_AMBULATORY_CARE_PROVIDER_SITE_OTHER): Payer: PPO

## 2024-01-10 DIAGNOSIS — E876 Hypokalemia: Secondary | ICD-10-CM

## 2024-01-10 LAB — BASIC METABOLIC PANEL
BUN: 20 mg/dL (ref 6–23)
CO2: 29 meq/L (ref 19–32)
Calcium: 9.2 mg/dL (ref 8.4–10.5)
Chloride: 102 meq/L (ref 96–112)
Creatinine, Ser: 0.91 mg/dL (ref 0.40–1.20)
GFR: 64.05 mL/min (ref >60.00)
Glucose, Bld: 85 mg/dL (ref 70–99)
Potassium: 3.8 meq/L (ref 3.5–5.1)
Sodium: 139 meq/L (ref 135–145)

## 2024-01-10 LAB — MAGNESIUM: Magnesium: 2.2 mg/dL (ref 1.5–2.5)

## 2024-01-11 ENCOUNTER — Encounter: Payer: Self-pay | Admitting: Internal Medicine

## 2024-02-22 DIAGNOSIS — Z853 Personal history of malignant neoplasm of breast: Secondary | ICD-10-CM | POA: Diagnosis not present

## 2024-02-22 DIAGNOSIS — Z1239 Encounter for other screening for malignant neoplasm of breast: Secondary | ICD-10-CM | POA: Diagnosis not present

## 2024-02-25 ENCOUNTER — Encounter: Payer: Self-pay | Admitting: Internal Medicine

## 2024-03-06 ENCOUNTER — Ambulatory Visit (INDEPENDENT_AMBULATORY_CARE_PROVIDER_SITE_OTHER): Payer: PPO | Admitting: *Deleted

## 2024-03-06 VITALS — Ht 63.5 in | Wt 116.0 lb

## 2024-03-06 DIAGNOSIS — Z0000 Encounter for general adult medical examination without abnormal findings: Secondary | ICD-10-CM

## 2024-03-06 NOTE — Progress Notes (Signed)
 Subjective:   Mackenzie Dean is a 73 y.o. who presents for a Medicare Wellness preventive visit.  Visit Complete: Virtual I connected with  Mackenzie Dean on 03/06/24 by a audio enabled telemedicine application and verified that I am speaking with the correct person using two identifiers.  Patient Location: Home  Provider Location: Office/Clinic  I discussed the limitations of evaluation and management by telemedicine. The patient expressed understanding and agreed to proceed.  Vital Signs: Because this visit was a virtual/telehealth visit, some criteria may be missing or patient reported. Any vitals not documented were not able to be obtained and vitals that have been documented are patient reported.  VideoDeclined- This patient declined Librarian, academic. Therefore the visit was completed with audio only.  Persons Participating in Visit: Patient.  AWV Questionnaire: No: Patient Medicare AWV questionnaire was not completed prior to this visit.  Cardiac Risk Factors include: advanced age (>74men, >11 women);dyslipidemia     Objective:    Today's Vitals   03/06/24 1422  Weight: 116 lb (52.6 kg)  Height: 5' 3.5" (1.613 m)   Body mass index is 20.23 kg/m.     03/06/2024    2:42 PM 03/04/2023    4:12 PM 01/14/2023    7:42 AM 09/24/2017    7:00 PM  Advanced Directives  Does Patient Have a Medical Advance Directive? Yes Yes Yes No  Type of Estate agent of [place];Living will Healthcare Power of [place];Living will    Does patient want to make changes to medical advance directive?  No - Patient declined    Copy of Healthcare Power of Attorney in Chart? No - copy requested No - copy requested    Would patient like information on creating a medical advance directive?    No - Patient declined    Current Medications (verified) Outpatient Encounter Medications as of 03/06/2024  Medication Sig   alendronate  (FOSAMAX ) 70 MG  tablet Take 1 tablet (70 mg total) by mouth every 7 (seven) days Take with a full glass of water. Do not lie down for the next 30 min.   Calcium  Carbonate (CALCIUM  500 PO) Take by mouth daily in the afternoon.   cetirizine (ZYRTEC) 10 MG tablet Take 10 mg by mouth daily.   estradiol  (ESTRACE  VAGINAL) 0.1 MG/GM vaginal cream PLACE 1 APPLICATORFUL VAGINALLY 2 TIMES A WEEK   Multiple Vitamins-Minerals (MULTIVITAMIN PO) Take 1 capsule by mouth daily.   rosuvastatin  (CRESTOR ) 10 MG tablet Take 1 tablet (10 mg total) by mouth every other day.   tretinoin  (RETIN-A ) 0.025 % cream Apply 1 Application topically at bedtime.   potassium chloride  SA (KLOR-CON  M) 20 MEQ tablet Take 1 tablet (20 mEq total) by mouth daily. (Patient not taking: Reported on 03/06/2024)   No facility-administered encounter medications on file as of 03/06/2024.    Allergies (verified) Augmentin [amoxicillin-pot clavulanate]   History: Past Medical History:  Diagnosis Date   Allergy    Basal cell carcinoma 02/26/2015   L lat breast - excision   Breast cancer (HCC) 2001   chemo and radiation   Chicken pox    Heart murmur    [employer]   Hyperlipidemia    Personal history of chemotherapy 2001   left breast ca   Personal history of radiation therapy 2001   left breast ca   Past Surgical History:  Procedure Laterality Date   BREAST BIOPSY Right 2001   negative   BREAST BIOPSY Left 2001  breast cancer invasive stage 1 ER +   BREAST LUMPECTOMY Left 2001   invasive mammary carcinoma   BREAST SURGERY Left 2002   COLONOSCOPY WITH PROPOFOL  N/A 01/14/2023   Procedure: COLONOSCOPY WITH PROPOFOL ;  Surgeon: Selena Daily, MD;  Location: [facility] ENDOSCOPY;  Service: Gastroenterology;  Laterality: N/A;   DILATION AND CURETTAGE OF UTERUS     Family History  Problem Relation Age of Onset   Mental retardation Mother    Heart disease Mother    Arrhythmia Mother    Dementia Mother    Heart disease Father    Stroke Father     Heart failure Paternal Aunt    Breast cancer Cousin    Breast cancer Cousin    Social History   Socioeconomic History   Marital status: Married    Spouse name: Not on file   Number of children: Not on file   Years of education: Not on file   Highest education level: Not on file  Occupational History   Not on file  Tobacco Use   Smoking status: Never    Passive exposure: Yes   Smokeless tobacco: Never  Vaping Use   Vaping status: Never Used  Substance and Sexual Activity   Alcohol use: Yes    Alcohol/week: 1.0 standard drink of alcohol    Types: 1 Glasses of wine per week    Comment: occassional   Drug use: No   Sexual activity: Yes  Other Topics Concern   Not on file  Social History Narrative   married   Social Drivers of Corporate investment banker Strain: Low Risk  (03/06/2024)   Overall Financial Resource Strain (CARDIA)    Difficulty of Paying Living Expenses: Not hard at all  Food Insecurity: No Food Insecurity (03/06/2024)   Hunger Vital Sign    Worried About Running Out of Food in the Last Year: Never true    Ran Out of Food in the Last Year: Never true  Transportation Needs: No Transportation Needs (03/06/2024)   PRAPARE - Administrator, Civil Service (Medical): No    Lack of Transportation (Non-Medical): No  Physical Activity: Sufficiently Active (03/06/2024)   Exercise Vital Sign    Days of Exercise per Week: 5 days    Minutes of Exercise per Session: 50 min  Stress: No Stress Concern Present (03/06/2024)   [employer] of Occupational Health - Occupational Stress Questionnaire    Feeling of Stress : Only a little  Social Connections: Socially Integrated (03/06/2024)   Social Connection and Isolation Panel [NHANES]    Frequency of Communication with Friends and Family: More than three times a week    Frequency of Social Gatherings with Friends and Family: More than three times a week    Attends Religious Services: More than 4 times per year     Active Member of [employer] or Organizations: Yes    Attends Engineer, structural: More than 4 times per year    Marital Status: Married    Tobacco Counseling Counseling given: Not Answered    Clinical Intake:  Pre-visit preparation completed: Yes  Pain : No/denies pain     BMI - recorded: 20.23 Nutritional Status: BMI of 19-24  Normal Nutritional Risks: None Diabetes: No  Lab Results  Component Value Date   HGBA1C 5.5 10/14/2022   HGBA1C 5.5 09/16/2020     How often do you need to have someone help you when you read instructions, pamphlets, or other written  materials from your doctor or pharmacy?: 1 - Never  Interpreter Needed?: No  Information entered by :: R. Kenetha Cozza LPN   Activities of Daily Living     03/06/2024    2:24 PM  In your present state of health, do you have any difficulty performing the following activities:  Hearing? 0  Vision? 0  Comment glasses  Difficulty concentrating or making decisions? 0  Walking or climbing stairs? 0  Dressing or bathing? 0  Doing errands, shopping? 0  Preparing Food and eating ? N  Using the Toilet? N  In the past six months, have you accidently leaked urine? Y  Comment small amount  Do you have problems with loss of bowel control? N  Managing your Medications? N  Managing your Finances? N  Housekeeping or managing your Housekeeping? N    Patient Care Team: Thersia Flax, MD as PCP - General (Internal Medicine)  Indicate any recent Medical Services you may have received from other than [facility] providers in the past year (date may be approximate).     Assessment:   This is a routine wellness examination for Mackenzie Dean.  Hearing/Vision screen Hearing Screening - Comments:: No issues Vision Screening - Comments:: glasses   Goals Addressed             This Visit's Progress    Patient Stated       Wants to continue to stay active       Depression Screen     03/06/2024    2:37 PM 12/22/2023   11:10  AM 03/04/2023    4:24 PM 10/14/2022    9:14 AM 03/31/2022    2:36 PM 10/13/2021    1:41 PM 09/16/2020    2:16 PM  PHQ 2/9 Scores  PHQ - 2 Score 0 0 0 0 0 0 0  PHQ- 9 Score 0          Fall Risk     03/06/2024    2:27 PM 12/22/2023   11:10 AM 03/04/2023    4:25 PM 10/14/2022    9:14 AM 03/31/2022    2:36 PM  Fall Risk   Falls in the past year? 1 0 0 0 0  Comment while sking      Number falls in past yr: 0 0 0    Injury with Fall? 0 0 0    Risk for fall due to : No Fall Risks;History of fall(s) No Fall Risks  No Fall Risks No Fall Risks  Risk for fall due to: Comment sking too fast      Follow up Falls prevention discussed;Falls evaluation completed Falls evaluation completed Falls evaluation completed;Falls prevention discussed Falls evaluation completed Falls evaluation completed    MEDICARE RISK AT HOME:  Medicare Risk at Home Any stairs in or around the home?: Yes If so, are there any without handrails?: Yes Home free of loose throw rugs in walkways, pet beds, electrical cords, etc?: Yes Adequate lighting in your home to reduce risk of falls?: Yes Life alert?: No Use of a cane, walker or w/c?: No Grab bars in the bathroom?: No Shower chair or bench in shower?: No Elevated toilet seat or a handicapped toilet?: Yes  TIMED UP AND GO:  Was the test performed?  No  Cognitive Function: 6CIT completed        03/06/2024    2:42 PM 03/04/2023    4:25 PM  6CIT Screen  What Year? 0 points 0 points  What month? 0  points 0 points  What time? 0 points 0 points  Count back from 20 0 points 0 points  Months in reverse 0 points 0 points  Repeat phrase 0 points 0 points  Total Score 0 points 0 points    Immunizations Immunization History  Administered Date(s) Administered   Fluad Quad(high Dose 65+) 08/15/2019, 09/16/2020, 10/13/2021, 09/01/2022   Fluad Trivalent(High Dose 65+) 09/10/2023   Influenza Split 08/20/2014, 09/17/2017   Influenza-Unspecified 08/17/2016, 09/21/2018    PFIZER Comirnaty (Gray Top)Covid-19 Tri-Sucrose Vaccine 05/02/2021   PFIZER(Purple Top)SARS-COV-2 Vaccination 11/23/2019, 12/14/2019, 08/16/2020   PNEUMOCOCCAL CONJUGATE-20 10/28/2022   Pfizer Covid-19 Vaccine Bivalent Booster 19yrs & up 10/23/2021   Pfizer(Comirnaty )Fall Seasonal Vaccine 12 years and older 10/02/2022, 09/10/2023   Tdap 06/05/2013, 12/27/2023   Zoster Recombinant(Shingrix) 12/13/2018, 12/23/2018, 12/23/2018, 07/19/2019, 07/19/2019   Zoster, Live 06/11/2014    Screening Tests Health Maintenance  Topic Date Due   MAMMOGRAM  08/05/2023   Medicare Annual Wellness (AWV)  03/03/2024   COVID-19 Vaccine (8 - Pfizer risk 2024-25 season) 03/10/2024   INFLUENZA VACCINE  06/16/2024   Colonoscopy  01/14/2028   DTaP/Tdap/Td (3 - Td or Tdap) 12/26/2033   Pneumonia Vaccine 10+ Years old  Completed   DEXA SCAN  Completed   Hepatitis C Screening  Completed   Zoster Vaccines- Shingrix  Completed   HPV VACCINES  Aged Out   Meningococcal B Vaccine  Aged Out    Health Maintenance  Health Maintenance Due  Topic Date Due   MAMMOGRAM  08/05/2023   Medicare Annual Wellness (AWV)  03/03/2024   COVID-19 Vaccine (8 - Pfizer risk 2024-25 season) 03/10/2024   Health Maintenance Items Addressed: Discussed mammogram order that was placed  12/22/23. Patient stated that she just had a MRI of her breast at [facility] 02/22/24 and does not think that she needs a mammogram at this time.  Additional Screening:  Vision Screening: Recommended annual ophthalmology exams for early detection of glaucoma and other disorders of the eye. Up to date [facility]  Dental Screening: Recommended annual dental exams for proper oral hygiene  Community Resource Referral / Chronic Care Management: CRR required this visit?  No   CCM required this visit?  No     Plan:     I have personally reviewed and noted the following in the patient's chart:   Medical and social history Use of alcohol, tobacco or illicit  drugs  Current medications and supplements including opioid prescriptions. Patient is not currently taking opioid prescriptions. Functional ability and status Nutritional status Physical activity Advanced directives List of other physicians Hospitalizations, surgeries, and ER visits in previous 12 months Vitals Screenings to include cognitive, depression, and falls Referrals and appointments  In addition, I have reviewed and discussed with patient certain preventive protocols, quality metrics, and best practice recommendations. A written personalized care plan for preventive services as well as general preventive health recommendations were provided to patient.     Felicitas Horse, LPN   1/61/0960   After Visit Summary: (MyChart) Due to this being a telephonic visit, the after visit summary with patients personalized plan was offered to patient via MyChart   Notes: Please refer to Routing Comments.

## 2024-03-06 NOTE — Patient Instructions (Signed)
 Mackenzie Dean , Thank you for taking time to come for your Medicare Wellness Visit. I appreciate your ongoing commitment to your health goals. Please review the following plan we discussed and let me know if I can assist you in the future.   Referrals/Orders/Follow-Ups/Clinician Recommendations: None  This is a list of the screening recommended for you and due dates:  Health Maintenance  Topic Date Due   Mammogram  08/05/2023   COVID-19 Vaccine (8 - Pfizer risk 2024-25 season) 03/10/2024   Flu Shot  06/16/2024   Medicare Annual Wellness Visit  03/06/2025   Colon Cancer Screening  01/14/2028   DTaP/Tdap/Td vaccine (3 - Td or Tdap) 12/26/2033   Pneumonia Vaccine  Completed   DEXA scan (bone density measurement)  Completed   Hepatitis C Screening  Completed   Zoster (Shingles) Vaccine  Completed   HPV Vaccine  Aged Out   Meningitis B Vaccine  Aged Out    Advanced directives: (Copy Requested) Please bring a copy of your health care power of attorney and living will to the office to be added to your chart at your convenience. You can mail to [facility] [address] [place], [place] [address] or email to [email or web address]  Next Medicare Annual Wellness Visit scheduled for next year: Yes 03/14/25 @ 1:40

## 2024-03-20 ENCOUNTER — Other Ambulatory Visit: Payer: Self-pay

## 2024-03-21 ENCOUNTER — Other Ambulatory Visit: Payer: Self-pay

## 2024-06-19 ENCOUNTER — Encounter: Payer: Self-pay | Admitting: Internal Medicine

## 2024-06-19 ENCOUNTER — Ambulatory Visit (INDEPENDENT_AMBULATORY_CARE_PROVIDER_SITE_OTHER): Payer: PPO | Admitting: Internal Medicine

## 2024-06-19 VITALS — BP 120/74 | HR 78 | Temp 98.0°F | Ht 63.0 in | Wt 117.0 lb

## 2024-06-19 DIAGNOSIS — M542 Cervicalgia: Secondary | ICD-10-CM

## 2024-06-19 DIAGNOSIS — I700 Atherosclerosis of aorta: Secondary | ICD-10-CM

## 2024-06-19 DIAGNOSIS — I479 Paroxysmal tachycardia, unspecified: Secondary | ICD-10-CM

## 2024-06-19 DIAGNOSIS — Z853 Personal history of malignant neoplasm of breast: Secondary | ICD-10-CM

## 2024-06-19 DIAGNOSIS — M810 Age-related osteoporosis without current pathological fracture: Secondary | ICD-10-CM | POA: Diagnosis not present

## 2024-06-19 DIAGNOSIS — E782 Mixed hyperlipidemia: Secondary | ICD-10-CM | POA: Diagnosis not present

## 2024-06-19 DIAGNOSIS — Z1231 Encounter for screening mammogram for malignant neoplasm of breast: Secondary | ICD-10-CM | POA: Diagnosis not present

## 2024-06-19 LAB — LIPID PANEL
Cholesterol: 171 mg/dL (ref 0–200)
HDL: 62.2 mg/dL
LDL Cholesterol: 93 mg/dL (ref 0–99)
NonHDL: 109.19
Total CHOL/HDL Ratio: 3
Triglycerides: 81 mg/dL (ref 0.0–149.0)
VLDL: 16.2 mg/dL (ref 0.0–40.0)

## 2024-06-19 LAB — COMPREHENSIVE METABOLIC PANEL WITH GFR
ALT: 19 U/L (ref 0–35)
AST: 20 U/L (ref 0–37)
Albumin: 4.5 g/dL (ref 3.5–5.2)
Alkaline Phosphatase: 38 U/L — ABNORMAL LOW (ref 39–117)
BUN: 19 mg/dL (ref 6–23)
CO2: 32 meq/L (ref 19–32)
Calcium: 9.4 mg/dL (ref 8.4–10.5)
Chloride: 99 meq/L (ref 96–112)
Creatinine, Ser: 0.83 mg/dL (ref 0.40–1.20)
GFR: 71.31 mL/min (ref >60.00)
Glucose, Bld: 94 mg/dL (ref 70–99)
Potassium: 4.7 meq/L (ref 3.5–5.1)
Sodium: 138 meq/L (ref 135–145)
Total Bilirubin: 0.7 mg/dL (ref 0.2–1.2)
Total Protein: 7.2 g/dL (ref 6.0–8.3)

## 2024-06-19 LAB — LDL CHOLESTEROL, DIRECT: Direct LDL: 90 mg/dL

## 2024-06-19 LAB — CK: Total CK: 65 U/L (ref 17–177)

## 2024-06-19 NOTE — Progress Notes (Unsigned)
 Subjective:  Patient ID: Mackenzie Dean, female    DOB: 12-Nov-1951  Age: 73 y.o. MRN: [id number]  CC: The primary encounter diagnosis was Mixed hyperlipidemia. Diagnoses of Breast cancer screening by mammogram, Thoracic aortic atherosclerosis (HCC), Paroxysmal tachycardia (HCC), Age-related osteoporosis without current pathological fracture, History of breast cancer in female, and Cervicalgia of occipito-atlanto-axial region were also pertinent to this visit.   HPI Mackenzie Dean presents for  Chief Complaint  Patient presents with   Medical Management of Chronic Issues    6 mo F/U     1) neck pain:  mild to moderate,  nonradiating,  has had several trips over the summer,  carrying shoulder bags through airport.  Some trapezius muscle soreness and spasm.    2) familial hyperlipidemia: taking Crestor  but worried about potential  effect on muscles.  Weakes up with polyathralgias,  which improve with activity.    Reviewed most recent  coronary calcium  score of zero, prior evidence of aortic atherosclerosis     Outpatient Medications Prior to Visit  Medication Sig Dispense Refill   alendronate  (FOSAMAX ) 70 MG tablet Take 1 tablet (70 mg total) by mouth every 7 (seven) days Take with a full glass of water. Do not lie down for the next 30 min. 12 tablet 3   Calcium  Carbonate (CALCIUM  500 PO) Take by mouth daily in the afternoon.     cetirizine (ZYRTEC) 10 MG tablet Take 10 mg by mouth daily.     estradiol  (ESTRACE  VAGINAL) 0.1 MG/GM vaginal cream PLACE 1 APPLICATORFUL VAGINALLY 2 TIMES A WEEK 127.5 g 3   Multiple Vitamins-Minerals (MULTIVITAMIN PO) Take 1 capsule by mouth daily.     potassium chloride  SA (KLOR-CON  M) 20 MEQ tablet Take 1 tablet (20 mEq total) by mouth daily. 5 tablet 0   rosuvastatin  (CRESTOR ) 10 MG tablet Take 1 tablet (10 mg total) by mouth every other day. 90 tablet 3   tretinoin  (RETIN-A ) 0.025 % cream Apply 1 Application topically at bedtime. 45 g 11   No  facility-administered medications prior to visit.    Review of Systems;  Patient denies headache, fevers, malaise, unintentional weight loss, skin rash, eye pain, sinus congestion and sinus pain, sore throat, dysphagia,  hemoptysis , cough, dyspnea, wheezing, chest pain, palpitations, orthopnea, edema, abdominal pain, nausea, melena, diarrhea, constipation, flank pain, dysuria, hematuria, urinary  Frequency, nocturia, numbness, tingling, seizures,  Focal weakness, Loss of consciousness,  Tremor, insomnia, depression, anxiety, and suicidal ideation.      Objective:  BP 120/74 (BP Location: Right Arm, Patient Position: Sitting, Cuff Size: Normal)   Pulse 78   Temp 98 F (36.7 C) (Oral)   Ht 5' 3 (1.6 m)   Wt 117 lb (53.1 kg)   SpO2 98%   BMI 20.73 kg/m   BP Readings from Last 3 Encounters:  06/19/24 120/74  12/22/23 120/74  09/02/23 122/78    Wt Readings from Last 3 Encounters:  06/19/24 117 lb (53.1 kg)  03/06/24 116 lb (52.6 kg)  12/22/23 119 lb 1.9 oz (54 kg)    Physical Exam Vitals reviewed.  Constitutional:      General: She is not in acute distress.    Appearance: Normal appearance. She is normal weight. She is not ill-appearing, toxic-appearing or diaphoretic.  HENT:     Head: Normocephalic.  Eyes:     General: No scleral icterus.       Right eye: No discharge.        Left eye:  No discharge.     Conjunctiva/sclera: Conjunctivae normal.  Cardiovascular:     Rate and Rhythm: Normal rate and regular rhythm.     Heart sounds: Normal heart sounds.  Pulmonary:     Effort: Pulmonary effort is normal. No respiratory distress.     Breath sounds: Normal breath sounds.  Musculoskeletal:        General: Normal range of motion.  Skin:    General: Skin is warm and dry.  Neurological:     General: No focal deficit present.     Mental Status: She is alert and oriented to person, place, and time. Mental status is at baseline.  Psychiatric:        Mood and Affect: Mood  normal.        Behavior: Behavior normal.        Thought Content: Thought content normal.        Judgment: Judgment normal.     Lab Results  Component Value Date   HGBA1C 5.5 10/14/2022   HGBA1C 5.5 09/16/2020    Lab Results  Component Value Date   CREATININE 0.83 06/19/2024   CREATININE 0.91 01/10/2024   CREATININE 0.85 12/22/2023    Lab Results  Component Value Date   WBC 4.9 12/22/2023   HGB 14.5 12/22/2023   HCT 42.5 12/22/2023   PLT 244.0 12/22/2023   GLUCOSE 94 06/19/2024   CHOL 171 06/19/2024   TRIG 81.0 06/19/2024   HDL 62.20 06/19/2024   LDLDIRECT 90.0 06/19/2024   LDLCALC 93 06/19/2024   ALT 19 06/19/2024   AST 20 06/19/2024   NA 138 06/19/2024   K 4.7 06/19/2024   CL 99 06/19/2024   CREATININE 0.83 06/19/2024   BUN 19 06/19/2024   CO2 32 06/19/2024   TSH 1.18 12/22/2023   HGBA1C 5.5 10/14/2022    DG Bone Density Result Date: 05/11/2023 EXAM: DUAL X-RAY ABSORPTIOMETRY (DXA) FOR BONE MINERAL DENSITY IMPRESSION: Your patient Mackenzie Dean completed a BMD test on 05/11/2023 using the [employer] iDXA DXA System (software version: 14.10) manufactured by [employer]. The following summarizes the results of our evaluation. Technologist: SCE PATIENT BIOGRAPHICAL: Name: Mackenzie, Dean Patient ID: [id number] Birth Date: 05/25/1951 Height: 63.5 in. Gender: Female Exam Date: 05/11/2023 Weight: 114.6 lbs. Indications: Caucasian, Height Loss, High Risk Meds, History of Breast Cancer, History of Fracture (Adult), History of Osteoporosis, Postmenopausal, Previous Chemo and Radiation Fractures: Left wrist Treatments: Calcium , Fosamax , Multi-Vitamin, zyrtec DENSITOMETRY RESULTS: Site      Region     Measured Date Measured Age WHO Classification Young Adult T-score BMD         %Change vs. Previous Significant Change (*) AP Spine L1-L2 05/11/2023 69.5 Osteopenia -1.8 0.959 g/cm2 11.6% Yes AP Spine L1-L2 10/21/2020 66.9 Osteoporosis -2.6 0.859 g/cm2 -8.5% Yes AP Spine  L1-L2 08/15/2015 61.8 Osteopenia -1.9 0.939 g/cm2 - - DualFemur Neck Left 05/11/2023 69.5 Osteopenia -1.3 0.862 g/cm2 7.3% Yes DualFemur Neck Left 10/21/2020 66.9 Osteopenia -1.7 0.803 g/cm2 -5.1% - DualFemur Neck Left 08/15/2015 61.8 Osteopenia -1.4 0.846 g/cm2 - - DualFemur Total Mean 05/11/2023 69.5 Normal -1.0 0.876 g/cm2 2.5% Yes DualFemur Total Mean 10/21/2020 66.9 Osteopenia -1.2 0.855 g/cm2 -3.2% Yes DualFemur Total Mean 08/15/2015 61.8 Normal -1.0 0.883 g/cm2 - - ASSESSMENT: The BMD measured at AP Spine L1-L2 is 0.959 g/cm2 with a T-score of -1.8. This patient is considered osteopenic according to World Health Organization [employer]) criteria. The scan quality is good. L-3 and L-4 was excluded due to degenerative changes. Compared  with prior study, there has been a significant increase in the spine. Compared with prior study, there has been a significant increase in the total hip. Patient is not a candidate for FRAX due to Fosamax . World Science writer [facility]) criteria for post-menopausal, Caucasian Women: Normal:                   T-score at or above -1 SD Osteopenia/low bone mass: T-score between -1 and -2.5 SD Osteoporosis:             T-score at or below -2.5 SD RECOMMENDATIONS: 1. All patients should optimize calcium  and vitamin D  intake. 2. Consider FDA-approved medical therapies in postmenopausal women and men aged 42 years and older, based on the following: a. A hip or vertebral(clinical or morphometric) fracture b. T-score < -2.5 at the femoral neck or spine after appropriate evaluation to exclude secondary causes c. Low bone mass (T-score between -1.0 and -2.5 at the femoral neck or spine) and a 10-year probability of a hip fracture > 3% or a 10-year probability of a major osteoporosis-related fracture > 20% based on the [place] -adapted WHO algorithm 3. Clinician judgment and/or patient preferences may indicate treatment for people with 10-year fracture probabilities above or below these levels  FOLLOW-UP: People with diagnosed cases of osteoporosis or at high risk for fracture should have regular bone mineral density tests. For patients eligible for Medicare, routine testing is allowed once every 2 years. The testing frequency can be increased to one year for patients who have rapidly progressing disease, those who are receiving or discontinuing medical therapy to restore bone mass, or have additional risk factors. I have reviewed this report, and agree with the above findings. [facility] Electronically Signed   By: Norman Hopper M.D.   On: 05/11/2023 12:26    Assessment & Plan:  .Mixed hyperlipidemia -     Comprehensive metabolic panel with GFR -     LDL cholesterol, direct -     CK -     Lipid panel  Breast cancer screening by mammogram -     3D Screening Mammogram, Left and Right; Future  Thoracic aortic atherosclerosis (HCC) Assessment & Plan: Noted on CT chest / coronary CT;   calcium  score was zero .SABRA  She has been tolerating  rosuvastatin  10 mg every other day since May 2022 but wants to suspend it to assess any effect it may be having on her joint pain .Baseline lipids today are fine,  and CK done today is NOT suggestive of myopathy  Lab Results  Component Value Date   CHOL 171 06/19/2024   HDL 62.20 06/19/2024   LDLCALC 93 06/19/2024   LDLDIRECT 90.0 06/19/2024   TRIG 81.0 06/19/2024   CHOLHDL 3 06/19/2024   Lab Results  Component Value Date   CKTOTAL 65 06/19/2024      Paroxysmal tachycardia (HCC) Assessment & Plan: Reviewed prior cardiology evaluation suggestive of SVT and coronary calcium  score of zero.   Has not used inderal  since prescription in 2020.    Age-related osteoporosis without current pathological fracture Assessment & Plan: Managed initially WITH  Ev[facility]  , started in 2007,  chosen because of her history of BRCA.  However by  DEXA in  Dec 2021 she experienced significant loss of density in the spine (T score dropped from  -1.9  in 2016 to -2.6 ) but nowhere else..  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 Repeat DEXA  in 2024 notes significant improvement in  BMD.  No changes today     History of breast cancer in female Assessment & Plan: No recurrence since treatment of original diagnosis in 2001.  She will continue  ANNUAL SCREENING  with G MAMMOGRAM alternating WITH MRI BREAST .     Cervicalgia of occipito-atlanto-axial region Assessment & Plan: Secondary to increased workload on shoulders due to travel . No radiation or weakness on exam.   Recommend use of massage and stretching. .        I spent 40 minutes on the day of this face to face encounter reviewing patient's  most recent visit with cardiology,   prior relevant surgical and non surgical procedures, recent  labs and imaging studies, counseling on  weight management,  reviewing the assessment and plan with patient, and post visit ordering and reviewing of  diagnostics and therapeutics with patient  .   Follow-up: No follow-ups on file.   Verneita LITTIE Kettering, MD

## 2024-06-19 NOTE — Patient Instructions (Addendum)
 Suspend the Crestor  for 3 months .  If the muscle enzyme is normal  but the pain resolves,  resume the crestor  along with CoQ 10 and let me know if it helps   If the CK (muscle enzyme) is elevated today,  I recommend STOPPING crestor  and considering Zetia.   If you are willing to try Zetia, you may tolerate it better than the statins that you did not tolerate previously.  It works by inhibiting the absorption of cholesterol by the small intestine, so it lowers LDL .  Your next screening mammogram is due in April and has been ordered  PLEASE CALL AND GET THIS SCHEDULED [facility] - call [phone]  Mackenzie Dean does  the scheduling for [facility] as well

## 2024-06-20 ENCOUNTER — Ambulatory Visit: Payer: Self-pay | Admitting: Internal Medicine

## 2024-06-20 NOTE — Assessment & Plan Note (Signed)
 Reviewed prior cardiology evaluation suggestive of SVT and coronary calcium  score of zero.   Has not used inderal  since prescription in 2020.

## 2024-06-20 NOTE — Assessment & Plan Note (Signed)
 Secondary to increased workload on shoulders due to travel . No radiation or weakness on exam.   Recommend use of massage and stretching. Mackenzie Dean

## 2024-06-20 NOTE — Assessment & Plan Note (Signed)
 Managed initially WITH  [facility]  , started in 2007,  chosen because of her history of BRCA.  However by  DEXA in  Dec 2021 she experienced significant loss of density in the spine (T score dropped from  -1.9 in 2016 to -2.6 ) but nowhere else..  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 Repeat DEXA  in 2024 notes significant improvement in  BMD.  No changes today

## 2024-06-20 NOTE — Assessment & Plan Note (Addendum)
 Noted on CT chest / coronary CT;   calcium  score was zero .Mackenzie Dean  She has been tolerating  rosuvastatin  10 mg every other day since May 2022 but wants to suspend it to assess any effect it may be having on her joint pain .Baseline lipids today are fine,  and CK done today is NOT suggestive of myopathy  Lab Results  Component Value Date   CHOL 171 06/19/2024   HDL 62.20 06/19/2024   LDLCALC 93 06/19/2024   LDLDIRECT 90.0 06/19/2024   TRIG 81.0 06/19/2024   CHOLHDL 3 06/19/2024   Lab Results  Component Value Date   CKTOTAL 65 06/19/2024

## 2024-06-20 NOTE — Assessment & Plan Note (Signed)
 No recurrence since treatment of original diagnosis in 2001.  She will continue  ANNUAL SCREENING  with G MAMMOGRAM alternating WITH MRI BREAST .

## 2024-06-29 ENCOUNTER — Other Ambulatory Visit: Payer: Self-pay

## 2024-06-30 ENCOUNTER — Other Ambulatory Visit: Payer: Self-pay

## 2024-08-09 ENCOUNTER — Ambulatory Visit: Payer: PPO | Admitting: Dermatology

## 2024-08-31 ENCOUNTER — Encounter: Payer: Self-pay | Admitting: Pharmacist

## 2024-08-31 NOTE — Progress Notes (Signed)
 Pharmacy Quality Measure Review  This patient is appearing on a report for being at risk of failing the adherence measure for cholesterol (statin) medications this calendar year.   Medication: rosuvastatin  10 mg Last fill date: 03/24/24 for 90 day supply  Intentionally discontinued per recent chart notes: Aug 2025: Suspend the crestor  for 3 months . If the muscle enzyme is normal but the pain resolves, resume the crestor  along with CoQ 10

## 2024-09-05 ENCOUNTER — Other Ambulatory Visit: Payer: Self-pay

## 2024-09-05 MED ORDER — CLINDAMYCIN HCL 300 MG PO CAPS
300.0000 mg | ORAL_CAPSULE | Freq: Four times a day (QID) | ORAL | 0 refills | Status: AC
  Filled 2024-09-05: qty 4, 1d supply, fill #0

## 2024-09-06 ENCOUNTER — Other Ambulatory Visit: Payer: Self-pay

## 2024-09-06 MED ORDER — CLINDAMYCIN HCL 150 MG PO CAPS
ORAL_CAPSULE | ORAL | 0 refills | Status: AC
  Filled 2024-09-06: qty 25, 6d supply, fill #0

## 2024-09-26 DIAGNOSIS — H353131 Nonexudative age-related macular degeneration, bilateral, early dry stage: Secondary | ICD-10-CM | POA: Diagnosis not present

## 2024-09-26 DIAGNOSIS — H25013 Cortical age-related cataract, bilateral: Secondary | ICD-10-CM | POA: Diagnosis not present

## 2024-09-26 DIAGNOSIS — H43812 Vitreous degeneration, left eye: Secondary | ICD-10-CM | POA: Diagnosis not present

## 2024-09-26 DIAGNOSIS — H5203 Hypermetropia, bilateral: Secondary | ICD-10-CM | POA: Diagnosis not present

## 2024-09-29 ENCOUNTER — Other Ambulatory Visit: Payer: Self-pay

## 2024-09-29 MED ORDER — TRIAMCINOLONE ACETONIDE 0.1 % EX CREA
TOPICAL_CREAM | Freq: Two times a day (BID) | CUTANEOUS | 0 refills | Status: AC
Start: 2024-09-29 — End: ?
  Filled 2024-09-29: qty 45, 90d supply, fill #0

## 2024-11-01 ENCOUNTER — Other Ambulatory Visit: Payer: Self-pay

## 2024-11-01 MED ORDER — FLUZONE HIGH-DOSE 0.5 ML IM SUSY
0.5000 mL | PREFILLED_SYRINGE | Freq: Once | INTRAMUSCULAR | 0 refills | Status: AC
  Filled 2024-11-01: qty 0.5, 1d supply, fill #0

## 2024-11-22 ENCOUNTER — Encounter: Payer: Self-pay | Admitting: Dermatology

## 2024-11-22 ENCOUNTER — Other Ambulatory Visit: Payer: Self-pay

## 2024-11-22 ENCOUNTER — Ambulatory Visit: Admitting: Dermatology

## 2024-11-22 DIAGNOSIS — Z79899 Other long term (current) drug therapy: Secondary | ICD-10-CM | POA: Diagnosis not present

## 2024-11-22 DIAGNOSIS — L578 Other skin changes due to chronic exposure to nonionizing radiation: Secondary | ICD-10-CM

## 2024-11-22 DIAGNOSIS — Z853 Personal history of malignant neoplasm of breast: Secondary | ICD-10-CM

## 2024-11-22 DIAGNOSIS — W908XXA Exposure to other nonionizing radiation, initial encounter: Secondary | ICD-10-CM

## 2024-11-22 DIAGNOSIS — D229 Melanocytic nevi, unspecified: Secondary | ICD-10-CM

## 2024-11-22 DIAGNOSIS — L708 Other acne: Secondary | ICD-10-CM

## 2024-11-22 DIAGNOSIS — L700 Acne vulgaris: Secondary | ICD-10-CM | POA: Diagnosis not present

## 2024-11-22 DIAGNOSIS — Z85828 Personal history of other malignant neoplasm of skin: Secondary | ICD-10-CM

## 2024-11-22 DIAGNOSIS — Z1283 Encounter for screening for malignant neoplasm of skin: Secondary | ICD-10-CM

## 2024-11-22 DIAGNOSIS — D1801 Hemangioma of skin and subcutaneous tissue: Secondary | ICD-10-CM

## 2024-11-22 DIAGNOSIS — L821 Other seborrheic keratosis: Secondary | ICD-10-CM | POA: Diagnosis not present

## 2024-11-22 DIAGNOSIS — L814 Other melanin hyperpigmentation: Secondary | ICD-10-CM | POA: Diagnosis not present

## 2024-11-22 DIAGNOSIS — L988 Other specified disorders of the skin and subcutaneous tissue: Secondary | ICD-10-CM | POA: Diagnosis not present

## 2024-11-22 DIAGNOSIS — Z7189 Other specified counseling: Secondary | ICD-10-CM

## 2024-11-22 MED ORDER — TRETINOIN 0.025 % EX CREA
TOPICAL_CREAM | Freq: Every day | CUTANEOUS | 11 refills | Status: AC
  Filled 2024-11-22: qty 45, 90d supply, fill #0

## 2024-11-22 NOTE — Patient Instructions (Addendum)
 Topical retinoid medications like tretinoin /Retin-A , adapalene/Differin, tazarotene/Fabior, and Epiduo/Epiduo Forte can cause dryness and irritation when first started. Only apply a pea-sized amount to the entire affected area. Avoid applying it around the eyes, edges of mouth and creases at the nose. If you experience irritation, use a good moisturizer first and/or apply the medicine less often. If you are doing well with the medicine, you can increase how often you use it until you are applying every night. Be careful with sun protection while using this medication as it can make you sensitive to the sun. This medicine should not be used by pregnant women.      Melanoma ABCDEs  Melanoma is the most dangerous type of skin cancer, and is the leading cause of death from skin disease.  You are more likely to develop melanoma if you: Have light-colored skin, light-colored eyes, or red or blond hair Spend a lot of time in the sun Tan regularly, either outdoors or in a tanning bed Have had blistering sunburns, especially during childhood Have a close family member who has had a melanoma Have atypical moles or large birthmarks  Early detection of melanoma is key since treatment is typically straightforward and cure rates are extremely high if we catch it early.   The first sign of melanoma is often a change in a mole or a new dark spot.  The ABCDE system is a way of remembering the signs of melanoma.  A for asymmetry:  The two halves do not match. B for border:  The edges of the growth are irregular. C for color:  A mixture of colors are present instead of an even brown color. D for diameter:  Melanomas are usually (but not always) greater than 6mm - the size of a pencil eraser. E for evolution:  The spot keeps changing in size, shape, and color.  Please check your skin once per month between visits. You can use a small mirror in front and a large mirror behind you to keep an eye on the back side  or your body.   If you see any new or changing lesions before your next follow-up, please call to schedule a visit.  Please continue daily skin protection including broad spectrum sunscreen SPF 30+ to sun-exposed areas, reapplying every 2 hours as needed when you're outdoors.   Staying in the shade or wearing long sleeves, sun glasses (UVA+UVB protection) and wide brim hats (4-inch brim around the entire circumference of the hat) are also recommended for sun protection.    Due to recent changes in healthcare laws, you may see results of your pathology and/or laboratory studies on MyChart before the doctors have had a chance to review them. We understand that in some cases there may be results that are confusing or concerning to you. Please understand that not all results are received at the same time and often the doctors may need to interpret multiple results in order to provide you with the best plan of care or course of treatment. Therefore, we ask that you please give us  2 business days to thoroughly review all your results before contacting the office for clarification. Should we see a critical lab result, you will be contacted sooner.   If You Need Anything After Your Visit  If you have any questions or concerns for your doctor, please call our main line at [phone] and press option 4 to reach your doctor's medical assistant. If no one answers, please leave a voicemail as directed  and we will return your call as soon as possible. Messages left after 4 pm will be answered the following business day.   You may also send us  a message via MyChart. We typically respond to MyChart messages within 1-2 business days.  For prescription refills, please ask your pharmacy to contact our office. Our fax number is [phone].  If you have an urgent issue when the clinic is closed that cannot wait until the next business day, you can page your doctor at the number below.    Please note that while  we do our best to be available for urgent issues outside of office hours, we are not available 24/7.   If you have an urgent issue and are unable to reach us , you may choose to seek medical care at your doctor's office, retail clinic, urgent care center, or emergency room.  If you have a medical emergency, please immediately call 911 or go to the emergency department.  Pager Numbers  - Dr. Hester: [phone]  - Dr. Jackquline: [phone]  - Dr. Claudene: [phone]   - Dr. Raymund: [phone]  In the event of inclement weather, please call our main line at [phone] for an update on the status of any delays or closures.  Dermatology Medication Tips: Please keep the boxes that topical medications come in in order to help keep track of the instructions about where and how to use these. Pharmacies typically print the medication instructions only on the boxes and not directly on the medication tubes.   If your medication is too expensive, please contact our office at [phone] option 4 or send us  a message through MyChart.   We are unable to tell what your co-pay for medications will be in advance as this is different depending on your insurance coverage. However, we may be able to find a substitute medication at lower cost or fill out paperwork to get insurance to cover a needed medication.   If a prior authorization is required to get your medication covered by your insurance company, please allow us  1-2 business days to complete this process.  Drug prices often vary depending on where the prescription is filled and some pharmacies may offer cheaper prices.  The website www.goodrx.com contains coupons for medications through different pharmacies. The prices here do not account for what the cost may be with help from insurance (it may be cheaper with your insurance), but the website can give you the price if you did not use any insurance.  - You can print the associated coupon and  take it with your prescription to the pharmacy.  - You may also stop by our office during regular business hours and pick up a GoodRx coupon card.  - If you need your prescription sent electronically to a different pharmacy, notify our office through [facility] or by phone at [phone] option 4.     Si Usted Necesita Algo Despus de Su Visita  Tambin puede enviarnos un mensaje a travs de Clinical Cytogeneticist. Por lo general respondemos a los mensajes de MyChart en el transcurso de 1 a 2 das hbiles.  Para renovar recetas, por favor pida a su farmacia que se ponga en contacto con nuestra oficina. Randi lakes de fax es [place] [phone].  Si tiene un asunto urgente cuando la clnica est cerrada y que no puede esperar hasta el siguiente da hbil, puede llamar/localizar a su doctor(a) al nmero que aparece a continuacin.   Por favor, tenga en cuenta que aunque  hacemos todo lo posible para estar disponibles para asuntos urgentes fuera del horario de oficina, no estamos disponibles las 24 horas del da, los 7 [address]  [address] de la [place].   Si tiene un problema urgente y no puede comunicarse con nosotros, puede optar por buscar atencin mdica  en el consultorio de su doctor(a), en una clnica privada, en un centro de atencin urgente o en una sala de emergencias.  Si tiene engineer, drilling, por favor llame inmediatamente al 911 o vaya a la sala de emergencias.  Nmeros de bper  - Dr. Hester: [phone]  - Dra. Jackquline: [phone]  - Dr. Claudene: [phone]  - Dra. Kitts: [phone]  En caso de inclemencias del [place], por favor llame a nuestra lnea principal al [phone] para una actualizacin sobre el estado de cualquier retraso o cierre.  Consejos para la medicacin en dermatologa: Por favor, guarde las cajas en las que vienen los medicamentos de uso tpico para ayudarle a seguir las instrucciones sobre dnde y cmo usarlos. Las farmacias generalmente imprimen las  instrucciones del medicamento slo en las cajas y no directamente en los tubos del [place].   Si su medicamento es muy caro, por favor, pngase en contacto con landry rieger llamando al [phone] y presione la opcin 4 o envenos un mensaje a travs de Clinical Cytogeneticist.   No podemos decirle cul ser su copago por los medicamentos por adelantado ya que esto es diferente dependiendo de la cobertura de su seguro. Sin embargo, es posible que podamos encontrar un medicamento sustituto a audiological scientist un formulario para que el seguro cubra el medicamento que se considera necesario.   Si se requiere una autorizacin previa para que su compaa de seguros cubra su medicamento, por favor permtanos de 1 a 2 das hbiles para completar este proceso.  Los precios de los medicamentos varan con frecuencia dependiendo del environmental consultant de dnde se surte la receta y alguna farmacias pueden ofrecer precios ms baratos.  El sitio web www.goodrx.com tiene cupones para medicamentos de health and safety inspector. Los precios aqu no tienen en cuenta lo que podra costar con la ayuda del seguro (puede ser ms barato con su seguro), pero el sitio web puede darle el precio si no utiliz tourist information centre manager.  - Puede imprimir el cupn correspondiente y llevarlo con su receta a la farmacia.  - Tambin puede pasar por nuestra oficina durante el horario de atencin regular y education officer, museum una tarjeta de cupones de GoodRx.  - Si necesita que su receta se enve electrnicamente a una farmacia diferente, informe a nuestra oficina a travs de MyChart de [place] o por telfono llamando al [phone] y presione la opcin 4.

## 2024-11-22 NOTE — Progress Notes (Signed)
 "  Follow-Up Visit   Subjective  Mackenzie Dean is a 74 y.o. female who presents for the following: Skin Cancer Screening and Full Body Skin Exam Hx of bcc, hx of breast cancer Hx of isk   Patient is bothered by milia at cheeks and would like to discuss other possible recommendations of treatment.   The patient presents for Total-Body Skin Exam (TBSE) for skin cancer screening and mole check. The patient has spots, moles and lesions to be evaluated, some may be new or changing and the patient may have concern these could be cancer.  The following portions of the chart were reviewed this encounter and updated as appropriate: medications, allergies, medical history  Review of Systems:  No other skin or systemic complaints except as noted in HPI or Assessment and Plan.  Objective  Well appearing patient in no apparent distress; mood and affect are within normal limits.  A full examination was performed including scalp, head, eyes, ears, nose, lips, neck, chest, axillae, abdomen, back, buttocks, bilateral upper extremities, bilateral lower extremities, hands, feet, fingers, toes, fingernails, and toenails. All findings within normal limits unless otherwise noted below.   Relevant physical exam findings are noted in the Assessment and Plan.  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  - Call if any new or changing lesions are noted between office visits    Hx of breast cancer Left Breast - No lymphadenopathy - Recommend regular full body skin exams - Recommend daily broad spectrum sunscreen SPF 30+ to sun-exposed areas, reapply every 2 hours as needed.  - Call if any new or changing lesions are  noted between office visits   SKIN CANCER SCREENING PERFORMED TODAY.  ACTINIC DAMAGE - Chronic condition, secondary to cumulative UV/sun exposure - diffuse scaly erythematous macules with underlying dyspigmentation - Recommend daily broad spectrum sunscreen SPF 30+ to sun-exposed areas, reapply every 2 hours as needed.  - Staying in the shade or wearing long sleeves, sun glasses (UVA+UVB protection) and wide brim hats (4-inch brim around the entire circumference of the hat) are also recommended for sun protection.  - Call for new or changing lesions.  LENTIGINES, SEBORRHEIC KERATOSES, HEMANGIOMAS - Benign normal skin lesions - Benign-appearing - Call for any changes   HEMANGIOMA 0.2 cm red macule at the left of midline forehead at hairline  Also noted a hemangioma at left chest examined and confirmed benign-appearing with dermatoscope.  See photo Both proven benign-appearing by dermatoscope Benign-appearing. Stable compared to previous visit. Observation.  Call clinic for new or changing moles.  Recommend daily use of broad spectrum spf 30+ sunscreen to sun-exposed areas.     MELANOCYTIC NEVI - Tan-brown and/or pink-flesh-colored symmetric macules and papules - Benign appearing on exam today - Observation - Call clinic for new or changing moles - Recommend daily use of broad spectrum spf 30+ sunscreen to sun-exposed areas.    Acne/ Milia  Severe with almost confluent deep closed comedones and milium of the bilateral cheeks and mid forehead Face Exam: confluent deep comedones and confluent milia on cheeks and forehead Chronic and persistent condition with duration or expected duration over one year. Condition is symptomatic / bothersome to patient. Not  to goal. Plan: Discussed recommend topical treatments first and will re-evaluate after 3 months. may consider more aggressive treatments such as Isotretinoin   Also discussed can do milia extraction if patient is interested  after 3 months to area on right cheek  Cont Tretinoin   0.25% cream apply at bedtime Use a facial moisturizer daily or at bedtime with Tretinoin  cream  Topical retinoid medications like tretinoin /Retin-A , adapalene/Differin, tazarotene/Fabior, and Epiduo/Epiduo Forte can cause dryness and irritation when first started. Only apply a pea-sized amount to the entire affected area. Avoid applying it around the eyes, edges of mouth and creases at the nose. If you experience irritation, use a good moisturizer first and/or apply the medicine less often. If you are doing well with the medicine, you can increase how often you use it until you are applying every night. Be careful with sun protection while using this medication as it can make you sensitive to the sun. This medicine should not be used by pregnant women.    Facial elastosis Discussed Alastin products as well as Skinceutical products especially Skinceutical vitamin C product. Tretinoin  as prescribed above. ACNE VULGARIS   This Visit - tretinoin  (RETIN-A ) 0.025 % cream - Apply pea sized amount to affected areas topically at bedtime. Return in about 4 months (around 03/22/2025) for milia/acne followup , 1 year tbse .  IEleanor Blush, CMA, am acting as scribe for Alm Rhyme, MD.   Documentation: I have reviewed the above documentation for accuracy and completeness, and I agree with the above.  Alm Rhyme, MD    "

## 2024-12-15 ENCOUNTER — Other Ambulatory Visit: Payer: Self-pay | Admitting: Internal Medicine

## 2024-12-18 ENCOUNTER — Other Ambulatory Visit: Payer: Self-pay

## 2024-12-20 ENCOUNTER — Other Ambulatory Visit: Payer: Self-pay

## 2024-12-20 ENCOUNTER — Other Ambulatory Visit: Payer: Self-pay | Admitting: Internal Medicine

## 2024-12-21 ENCOUNTER — Other Ambulatory Visit: Payer: Self-pay

## 2024-12-21 MED FILL — Alendronate Sodium Tab 70 MG: ORAL | 84 days supply | Qty: 12 | Fill #0 | Status: AC

## 2024-12-25 ENCOUNTER — Encounter: Admitting: Internal Medicine

## 2025-03-14 ENCOUNTER — Ambulatory Visit

## 2025-03-22 ENCOUNTER — Ambulatory Visit: Admitting: Dermatology

## 2025-11-22 ENCOUNTER — Ambulatory Visit: Admitting: Dermatology
